# Patient Record
Sex: Female | Born: 1965 | Race: White | Hispanic: No | Marital: Married | Smoking: Current every day smoker
Health system: Southern US, Community
[De-identification: ages and names within clinical notes are randomized; demographics above are authoritative.]

## PROBLEM LIST (undated history)

## (undated) DIAGNOSIS — F32A Depression, unspecified: Secondary | ICD-10-CM

## (undated) DIAGNOSIS — F1010 Alcohol abuse, uncomplicated: Secondary | ICD-10-CM

## (undated) DIAGNOSIS — F329 Major depressive disorder, single episode, unspecified: Secondary | ICD-10-CM

## (undated) DIAGNOSIS — Z720 Tobacco use: Secondary | ICD-10-CM

## (undated) DIAGNOSIS — F419 Anxiety disorder, unspecified: Secondary | ICD-10-CM

## (undated) HISTORY — PX: ABDOMINAL HYSTERECTOMY: SHX81

---

## 2005-04-18 ENCOUNTER — Ambulatory Visit: Payer: Self-pay | Admitting: Otolaryngology

## 2005-09-20 ENCOUNTER — Ambulatory Visit: Payer: Self-pay | Admitting: Obstetrics and Gynecology

## 2006-04-06 ENCOUNTER — Ambulatory Visit: Payer: Self-pay | Admitting: Obstetrics and Gynecology

## 2006-04-16 ENCOUNTER — Ambulatory Visit: Payer: Self-pay | Admitting: Obstetrics and Gynecology

## 2006-07-22 ENCOUNTER — Emergency Department: Payer: Self-pay

## 2013-06-05 ENCOUNTER — Emergency Department: Payer: Self-pay | Admitting: Emergency Medicine

## 2013-06-05 LAB — COMPREHENSIVE METABOLIC PANEL
ALK PHOS: 60 U/L
ALT: 116 U/L — AB (ref 12–78)
Albumin: 4 g/dL (ref 3.4–5.0)
Anion Gap: 9 (ref 7–16)
BUN: 5 mg/dL — AB (ref 7–18)
Bilirubin,Total: 0.3 mg/dL (ref 0.2–1.0)
CREATININE: 0.44 mg/dL — AB (ref 0.60–1.30)
Calcium, Total: 9 mg/dL (ref 8.5–10.1)
Chloride: 103 mmol/L (ref 98–107)
Co2: 26 mmol/L (ref 21–32)
EGFR (African American): >60
Glucose: 79 mg/dL (ref 65–99)
Osmolality: 272 (ref 275–301)
Potassium: 3.8 mmol/L (ref 3.5–5.1)
SGOT(AST): 75 U/L — ABNORMAL HIGH (ref 15–37)
Sodium: 138 mmol/L (ref 136–145)
TOTAL PROTEIN: 8 g/dL (ref 6.4–8.2)

## 2013-06-05 LAB — CBC
HCT: 45.5 % (ref 35.0–47.0)
HGB: 15.2 g/dL (ref 12.0–16.0)
MCH: 33.5 pg (ref 26.0–34.0)
MCHC: 33.3 g/dL (ref 32.0–36.0)
MCV: 101 fL — ABNORMAL HIGH (ref 80–100)
PLATELETS: 252 10*3/uL (ref 150–440)
RBC: 4.53 10*6/uL (ref 3.80–5.20)
RDW: 13.4 % (ref 11.5–14.5)
WBC: 4 10*3/uL (ref 3.6–11.0)

## 2013-06-05 LAB — DRUG SCREEN, URINE

## 2013-06-05 LAB — URINALYSIS, COMPLETE
Bacteria: NONE SEEN
Bilirubin,UR: NEGATIVE
Glucose,UR: NEGATIVE mg/dL (ref 0–75)
KETONE: NEGATIVE
LEUKOCYTE ESTERASE: NEGATIVE
Nitrite: NEGATIVE
Ph: 5 (ref 4.5–8.0)
Protein: NEGATIVE
Specific Gravity: 1.003 (ref 1.003–1.030)
Squamous Epithelial: 1
WBC UR: 1 /HPF (ref 0–5)

## 2013-06-05 LAB — ETHANOL
ETHANOL %: 0.287 % — AB (ref 0.000–0.080)
Ethanol: 287 mg/dL

## 2013-06-05 LAB — ACETAMINOPHEN LEVEL: Acetaminophen: <2 ug/mL

## 2013-06-05 LAB — TSH: THYROID STIMULATING HORM: 2.3 u[IU]/mL

## 2013-06-05 LAB — SALICYLATE LEVEL: Salicylates, Serum: 2.3 mg/dL

## 2013-06-27 ENCOUNTER — Emergency Department: Payer: Self-pay | Admitting: Emergency Medicine

## 2013-06-27 LAB — COMPREHENSIVE METABOLIC PANEL
Albumin: 4 g/dL (ref 3.4–5.0)
Alkaline Phosphatase: 56 U/L
Anion Gap: 12 (ref 7–16)
BILIRUBIN TOTAL: 0.2 mg/dL (ref 0.2–1.0)
BUN: 3 mg/dL — AB (ref 7–18)
CHLORIDE: 102 mmol/L (ref 98–107)
CO2: 26 mmol/L (ref 21–32)
Calcium, Total: 8.7 mg/dL (ref 8.5–10.1)
Creatinine: 0.45 mg/dL — ABNORMAL LOW (ref 0.60–1.30)
EGFR (African American): >60
EGFR (Non-African Amer.): >60
GLUCOSE: 90 mg/dL (ref 65–99)
Osmolality: 275 (ref 275–301)
POTASSIUM: 3.6 mmol/L (ref 3.5–5.1)
SGOT(AST): 34 U/L (ref 15–37)
SGPT (ALT): 55 U/L (ref 12–78)
SODIUM: 140 mmol/L (ref 136–145)
TOTAL PROTEIN: 7.9 g/dL (ref 6.4–8.2)

## 2013-06-27 LAB — DRUG SCREEN, URINE
Amphetamines, Ur Screen: NEGATIVE (ref ?–1000)
BENZODIAZEPINE, UR SCRN: NEGATIVE (ref ?–200)
Barbiturates, Ur Screen: NEGATIVE (ref ?–200)
Cannabinoid 50 Ng, Ur ~~LOC~~: POSITIVE (ref ?–50)
Cocaine Metabolite,Ur ~~LOC~~: NEGATIVE (ref ?–300)
MDMA (Ecstasy)Ur Screen: NEGATIVE (ref ?–500)
Methadone, Ur Screen: NEGATIVE (ref ?–300)
Opiate, Ur Screen: NEGATIVE (ref ?–300)
Phencyclidine (PCP) Ur S: NEGATIVE (ref ?–25)
Tricyclic, Ur Screen: NEGATIVE (ref ?–1000)

## 2013-06-27 LAB — ETHANOL
Ethanol %: 0.346 % (ref 0.000–0.080)
Ethanol: 346 mg/dL

## 2013-06-27 LAB — URINALYSIS, COMPLETE
Bacteria: NONE SEEN
Bilirubin,UR: NEGATIVE
Blood: NEGATIVE
GLUCOSE, UR: NEGATIVE mg/dL (ref 0–75)
KETONE: NEGATIVE
Leukocyte Esterase: NEGATIVE
Nitrite: NEGATIVE
PH: 6 (ref 4.5–8.0)
PROTEIN: NEGATIVE
RBC, UR: NONE SEEN /HPF (ref 0–5)
Specific Gravity: 1.002 (ref 1.003–1.030)
Squamous Epithelial: <1
WBC UR: NONE SEEN /HPF (ref 0–5)

## 2013-06-27 LAB — CBC
HCT: 46.6 % (ref 35.0–47.0)
HGB: 15.4 g/dL (ref 12.0–16.0)
MCH: 33.1 pg (ref 26.0–34.0)
MCHC: 33.1 g/dL (ref 32.0–36.0)
MCV: 100 fL (ref 80–100)
Platelet: 249 10*3/uL (ref 150–440)
RBC: 4.66 10*6/uL (ref 3.80–5.20)
RDW: 12.7 % (ref 11.5–14.5)
WBC: 4.6 10*3/uL (ref 3.6–11.0)

## 2013-06-27 LAB — ACETAMINOPHEN LEVEL: Acetaminophen: <2 ug/mL

## 2013-06-27 LAB — SALICYLATE LEVEL: Salicylates, Serum: 2.6 mg/dL

## 2013-07-22 ENCOUNTER — Emergency Department: Payer: Self-pay | Admitting: Emergency Medicine

## 2013-07-22 LAB — COMPREHENSIVE METABOLIC PANEL
ALBUMIN: 4.3 g/dL (ref 3.4–5.0)
ALK PHOS: 63 U/L
ALT: 90 U/L — AB (ref 12–78)
ANION GAP: 11 (ref 7–16)
BUN: 4 mg/dL — ABNORMAL LOW (ref 7–18)
Bilirubin,Total: 0.3 mg/dL (ref 0.2–1.0)
CHLORIDE: 93 mmol/L — AB (ref 98–107)
Calcium, Total: 8.1 mg/dL — ABNORMAL LOW (ref 8.5–10.1)
Co2: 22 mmol/L (ref 21–32)
Creatinine: 0.62 mg/dL (ref 0.60–1.30)
EGFR (African American): >60
EGFR (Non-African Amer.): >60
GLUCOSE: 87 mg/dL (ref 65–99)
Osmolality: 250 (ref 275–301)
POTASSIUM: 3.9 mmol/L (ref 3.5–5.1)
SGOT(AST): 100 U/L — ABNORMAL HIGH (ref 15–37)
Sodium: 126 mmol/L — ABNORMAL LOW (ref 136–145)
TOTAL PROTEIN: 7.7 g/dL (ref 6.4–8.2)

## 2013-07-22 LAB — CBC
HCT: 40.7 % (ref 35.0–47.0)
HGB: 14 g/dL (ref 12.0–16.0)
MCH: 34.2 pg — ABNORMAL HIGH (ref 26.0–34.0)
MCHC: 34.4 g/dL (ref 32.0–36.0)
MCV: 99 fL (ref 80–100)
Platelet: 187 10*3/uL (ref 150–440)
RBC: 4.1 10*6/uL (ref 3.80–5.20)
RDW: 13.5 % (ref 11.5–14.5)
WBC: 5.7 10*3/uL (ref 3.6–11.0)

## 2013-07-22 LAB — URINALYSIS, COMPLETE
BACTERIA: NONE SEEN
Bilirubin,UR: NEGATIVE
Glucose,UR: NEGATIVE mg/dL (ref 0–75)
Ketone: NEGATIVE
LEUKOCYTE ESTERASE: NEGATIVE
Nitrite: NEGATIVE
PH: 5 (ref 4.5–8.0)
Protein: 30
SPECIFIC GRAVITY: 1.008 (ref 1.003–1.030)
Squamous Epithelial: 1
WBC UR: NONE SEEN /HPF (ref 0–5)

## 2013-07-22 LAB — ACETAMINOPHEN LEVEL: Acetaminophen: <2 ug/mL

## 2013-07-22 LAB — DRUG SCREEN, URINE
Amphetamines, Ur Screen: NEGATIVE (ref ?–1000)
BENZODIAZEPINE, UR SCRN: NEGATIVE (ref ?–200)
Barbiturates, Ur Screen: NEGATIVE (ref ?–200)
COCAINE METABOLITE, UR ~~LOC~~: NEGATIVE (ref ?–300)
Cannabinoid 50 Ng, Ur ~~LOC~~: POSITIVE (ref ?–50)
MDMA (ECSTASY) UR SCREEN: NEGATIVE (ref ?–500)
Methadone, Ur Screen: NEGATIVE (ref ?–300)
Opiate, Ur Screen: NEGATIVE (ref ?–300)
PHENCYCLIDINE (PCP) UR S: NEGATIVE (ref ?–25)
TRICYCLIC, UR SCREEN: NEGATIVE (ref ?–1000)

## 2013-07-22 LAB — PREGNANCY, URINE: Pregnancy Test, Urine: NEGATIVE m[IU]/mL

## 2013-07-22 LAB — ETHANOL
ETHANOL LVL: 447 mg/dL — AB
Ethanol %: 0.447 % (ref 0.000–0.080)

## 2013-07-22 LAB — SALICYLATE LEVEL: Salicylates, Serum: 3.1 mg/dL — ABNORMAL HIGH

## 2013-07-22 LAB — TSH: Thyroid Stimulating Horm: 0.687 u[IU]/mL

## 2013-07-23 LAB — SODIUM: SODIUM: 133 mmol/L — AB (ref 136–145)

## 2014-05-30 NOTE — Consult Note (Signed)
PATIENT NAMAlisia Mercer:  Sturdevant, [name] MR#:  [id number] DATE OF BIRTH:  Jan 13, 1966  DATE OF CONSULTATION:  06/28/2013  REFERRING PHYSICIAN:   CONSULTING PHYSICIAN:  Elaura Calix K. Guss Bundehalla, MD  PLACE OF DICTATION:  [facility], [place].  AGE:  48 years.  SEX:  Female.  RACE:  White.  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  The patient has two children, a 47 year old and 47 year old daughters.  Her 47 year old daughter moved in to live with her and has been since August of 2014 because she had no place to go and she was pregnant.  According to the information obtained, daughter stated that the patient was aggressive towards the daughter.  The patient reports that she does not remember being aggressive to the daughter.  She does admit that she drinks 4 or 5 beers sometimes and she was working in the yard and she does not remember having threatened her daughter.    PAST PSYCHIATRIC HISTORY:  Inpatient [facility] for a 28 day program many years ago and she stayed sober for one year and 8 months and started drinking again because of being with the wrong crowds.    ALCOHOL AND DRUGS:  Admits that she has been drinking alcohol at a rate of 4 to 5 beers every other day, had 2 DWIs and lost her driver's license.  She gets around by getting rides from her husband.  Never arrested for public drunkenness.  Denies any other street or prescription drug abuse.  Denies using IV drugs.  Does admit smoking nicotine cigarettes.    MENTAL STATUS:  The patient is dressed in hospital scrubs, alert and oriented, fully aware of situation that brought her to [facility].  Affect is bright and cheerful.  Denies feeling depressed.  Denies feeling hopeless or helpless.  No psychosis.  Denies auditory or visual hallucinations.  Denies feeling  anxious.  Denies any ideas of plans to hurt herself or others.  Cognition intact.  General knowledge of information fair, level of education.  Insight and judgment fair.   IMPRESSION:  Alcohol abuse with intoxication at the time of admission.  Recommend discontinue IVC, discharge the patient to go back home to live with her husband and the patient will contact [facility] if she needs help with her alcohol abuse problems and is aware of the same.    ____________________________ Jannet MantisSurya K. Guss Bundehalla, MD skc:ea D: 06/28/2013 21:16:36 ET T: 06/29/2013 02:58:48 ET JOB#: [id number]  cc: Monika SalkSurya K. Guss Bundehalla, MD, <Dictator> Beau FannySURYA K Analisa Sledd MD ELECTRONICALLY SIGNED 06/29/2013 20:19

## 2014-05-30 NOTE — Consult Note (Signed)
Psychiatry: Follow-up for this 49 year old woman with alcohol dependence.  She is being treated for alcohol withdrawal here in the hospital after presenting intoxicated and after suffering a fall with a head injury.  Today she complains of ongoing headache.  She says she is not feeling shaky.  Denies being sick to her stomach.  Denies suicidal ideation denies feeling depressed. review of systems she denies suicidal or homicidal ideation denies visual auditory or tactile hallucinations.  Denies feeling sick to her stomach denies tremulousness.  Generally negative review of systems. status exam: Slightly disheveled woman looks her stated age cooperative with the interview.  Eye contact good psychomotor activity sluggish.  She clearly still has a tremor despite her protest otherwise.  She is slow in her psychomotor activity and slow and quiet in her speech.  Affect blunted mood stated as being okay.  Thoughts are slow but lucid no evidence of delusional thinking.  Denies auditory or visual hallucinations denies suicidal or homicidal ideation.  Judgment and insight questionable.  Alert and oriented 4. who clearly has alcohol dependence and has presented several times to the emergency room this spring intoxicated.  She's been let go with counseling to stop drinking and has not followed up on any of it.  He said head injuries demonstrates the life-threatening nature of her condition.  At this point since she is under involuntary commitment I think the right thing to do is to get her into more serious substance abuse treatment.  Patient was advised of this.  She would prefer to go home but I think her risk of relapse and life-threatening behavior is high. plan is to continue detox protocol and monitoring here.  Referred to alcohol and drug abuse treatment Center with likely admission tomorrow. dependence diagnosis, substance induced mood disorder versus depression  Electronic Signatures: Clapacs, Jackquline DenmarkJohn T (MD)   (Signed on 18-Jun-15 15:53)  Authored  Last Updated: 18-Jun-15 15:53 by Audery Amellapacs, John T (MD)

## 2014-05-30 NOTE — Consult Note (Signed)
PATIENT NAME:  Tanya FerrariREAVIS, Marene T MR#:  [id number] DATE OF BIRTH:  [birth year]  DATE OF CONSULTATION:  07/22/2013  REFERRING PHYSICIAN:   CONSULTING PHYSICIAN:  Audery AmelJohn T. Clapacs, MD  IDENTIFYING INFORMATION AND REASON FOR CONSULTATION: A 49 year old woman a history of alcohol dependence, presents to the Emergency Room intoxicated with a report from her family that she had fallen down several times today and hit her head. Evaluation for appropriate disposition.   HISTORY OF PRESENT ILLNESS: Information obtained from the patient, the chart and the accompanying paperwork. The patient states that she has been drinking "too much." She says that she drinks probably about 15 to 20 beers a day. Also smokes marijuana regularly. She remembers that she did fall down and hit her head today. She does not have much more memory of what had happened in the last day. Denies suicidal or homicidal ideation. Reports mood as being bad but does not have a full major depressive syndrome. Does not report any psychotic symptoms.   PAST PSYCHIATRIC HISTORY: She has presented to our Emergency Room several times this spring intoxicated. She has been discharged, it looks like, and referred for followup treatment but it appears she is not going for any, or at least nothing that has kept her from drinking. She had reported on 1 of those visits that she had been to a rehab program in [place] and had stayed sober for about a year at that point. She is currently taking Zoloft and trazodone, unknown doses. Denies suicidal history. Denies homicidal behavior.   FAMILY HISTORY: Unknown.   SOCIAL HISTORY: Lives with her husband, her adult daughter and her daughter's boyfriend. She says there is often strife and fighting at home.   PAST MEDICAL HISTORY: Does not know of any significant ongoing medical problems.   CURRENT MEDICATIONS: Trazodone, unknown dose at night; Zoloft, unknown dose in the morning.   ALLERGIES: No known drug allergies.    REVIEW OF SYSTEMS: Complains of pain in her head, especially the left frontal area. She denies feeling sick to her stomach, denies throwing up. She does feel tired and worn out. She denies suicidal or homicidal ideation. Denies hallucinations, otherwise negative.   MENTAL STATUS EXAMINATION: Slightly disheveled woman, looks older than her stated age. She is passively cooperative but still intoxicated. Eye contact intermittent. Psychomotor activity very sluggish. Speech decreased in total amount and slow but easy to understand. Affect blunted. Mood stated as being bad. Thoughts are slow but lucid. No evidence of delusional thinking or loosening of associations. Denies auditory or visual hallucinations. Denies suicidal or homicidal ideation. Judgment and insight adequate. Intelligence normal. Short- and long-term memory slightly impaired.   LABORATORY, DIAGNOSTIC AND RADIOLOGICAL DATA:  A head CT was ordered and it does show some possible abnormalities. Frontal horn of the right lateral ventricle is somewhat attenuated. There is question about a possible infarct in the area. There are left frontal and occipital scalp hematomas. 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   Urinalysis does not appear to be infected. Pregnancy test negative. Drug screen positive for cannabis. TSH normal. CBC unremarkable. Alcohol level was 447 at about 1:00 today. Sodium low at 126, chloride low at 93. ALT and AST both elevated.   ASSESSMENT: A 49 year old woman with alcohol dependence, highly intoxicated, falling down, not able to take care of herself, needs inpatient treatment for detox and substance abuse treatment.   TREATMENT PLAN: Emergency Room physician earlier today placed her under involuntary commitment. We do not have any beds  available on our unit right now. I will put her on some standing Librium. She already has other appropriate detox medicines available. Education done with the patient. She has been  referred to the alcohol and drug abuse treatment center and we will follow up in hopes of getting her transferred tomorrow.   DIAGNOSIS, PRINCIPAL AND PRIMARY:  AXIS I: Alcohol dependence.   SECONDARY DIAGNOSES: AXIS I: Delirium due to alcohol intoxication.  AXIS II: Deferred.  AXIS III: Mild head injury.  AXIS IV: Severe, moderate chronic stress at home.  AXIS V: Functioning at time of evaluation 30.   ____________________________ Audery Amel, MD jtc:cs D: 07/22/2013 17:51:51 ET T: 07/22/2013 18:07:15 ET JOB#: [id number]  cc: Audery Amel, MD, <Dictator> Audery Amel MD ELECTRONICALLY SIGNED 07/23/2013 13:24

## 2014-05-30 NOTE — Consult Note (Signed)
PATIENT NAME:  Tanya Mercer, Tanya Mercer MR#:  [id number] DATE OF BIRTH:  07/12/1965  DATE OF CONSULTATION:  07/25/2013  CONSULTING PHYSICIAN:  Audery AmelJohn Mercer. Daniyal Tabor, MD  HISTORY: Since my last interaction with this patient, she is feeling better. She is feeling less shaky. She is feeling less nervous. Her mood is feeling more calm. She is no longer having a visible tremor in her hands. She is not reporting any suicidal ideation. She is stating better insight and is agreeable to substance abuse treatment.   REVIEW OF SYSTEMS: Denies suicidal ideation. Denies depression. Denies psychotic symptoms. No hallucinations. No feeling of tremor. No nausea. Generally negative review of systems.   MENTAL STATUS EXAMINATION: Neatly dressed and groomed woman, looks her stated age, cooperative and pleasant. Good eye contact. Normal psychomotor activity. No sign of tremor. Speech is clear and focused and no longer halting. Affect is appropriate and reactive. Mood is stated as okay. Thoughts appear lucid and clear, rather than confused or delirious. Denies suicidal ideation. Denies hallucinations. Short-term and long-term memory intact. Judgment and insight improved.   VITAL SIGNS: Her blood pressure most recently was 125/87, still had a pulse 104, temperature 97.8.   ASSESSMENT: The patient has been waiting in the Emergency Room because I wanted to refer her to the [facility]. She has now been in the Emergency Room since June 16 and at this point, we are told there will not be a bed available for her until at least the 22nd. She is through the worst part of alcohol withdrawal and past the window of seizures and probably out of any risk of delirium tremens. The patient is requesting discharge. At this point, I think she no longer meets commitment criteria. I am going to discontinue her involuntary commitment and recommend she be released home. She has been counseled very clearly about the life-threatening  nature of continued drinking and the importance of engaging in treatment and agrees [facility].   DIAGNOSIS, PRINCIPAL AND PRIMARY:  AXIS I: Alcohol dependence.   SECONDARY DIAGNOSES: AXIS I: Substance-induced mood disorder.  AXIS II: Deferred.  AXIS III: Alcohol withdrawal.  AXIS IV: Moderate.  AXIS V: Functioning at time of discharge 55.    ____________________________ Audery AmelJohn Mercer. Mauri Temkin, MD jtc:jcm D: 07/25/2013 16:07:13 ET Mercer: 07/25/2013 19:01:21 ET JOB#: [id number]  cc: Audery AmelJohn Mercer. Jaqlyn Gruenhagen, MD, <Dictator> Audery AmelJOHN Mercer Lawrence Mitch MD ELECTRONICALLY SIGNED 07/29/2013 17:10

## 2014-05-30 NOTE — Consult Note (Signed)
PATIENT NAME:  Tanya Mercer, Tanya Mercer MR#:  [id number] DATE OF BIRTH:  [birth year]  DATE OF CONSULTATION:  07/23/2013  CONSULTING PHYSICIAN:  Audery AmelJohn Mercer. Clapacs, MD  IDENTIFYING INFORMATION AND REASON FOR CONSULTATION: A followup on this 49 year old woman who came into the hospital intoxicated having hit her head yesterday. She had a blood alcohol level of nearly 450 when she came in yesterday afternoon; also had struck her head. There seems to be no sign of fracture or internal brain injury. The patient is now going through alcohol withdrawal. She does complain of feeling shaky. She feels a little anxious. Denies any other specific physical symptoms.   REVIEW OF SYSTEMS: Denies suicidal or homicidal ideation. Denies hallucinations. Denies feeling depressed. Endorses feeling anxious. Does admit to feeling shaky all over and a little bit sick to her stomach.   MENTAL STATUS EXAMINATION: Disheveled woman looks older than her stated age, passively cooperative. Eye contact decreased. Psychomotor activity fidgety, tremulous, a little bit confused. Affect seems flattened and confused as to her thought patterns. Slow thinking, slow speech. Mood stated as being all right. Denies hallucinations. Denies suicidal or homicidal ideation. Alert and oriented to her current situation. Judgment and insight impaired about her substance abuse. Short and long-term memory both somewhat impaired.   VITAL SIGNS: Currently, her blood pressure is 141/79, pulse 106, respirations 20, temperature 97.8.   ASSESSMENT:  A 49 year old woman presented to the hospital intoxicated. This is at least the third time she has presented to the Emergency Room this spring intoxicated. This time she had several falls, including one pretty severe fall banging her head. She has clearly not been able to stop drinking on her own and is clearly not going to any outpatient substance abuse treatment. Her behavior is getting worse and is becoming more life  threatening. Currently, she is showing obvious physical symptoms of alcohol withdrawal. She is at risk for delirium tremens and possibly a seizure.   TREATMENT PLAN: She was placed under involuntary commitment by the admitting physician. We will go ahead and refer her to the alcohol and drug abuse treatment center. Meanwhile, I am putting her on the regular CIWA protocol. We will monitor for any other signs or symptoms of alcohol withdrawal.   DIAGNOSIS, PRINCIPAL AND PRIMARY:  AXIS I: Alcohol dependence.   SECONDARY DIAGNOSES: AXIS I:   Delirium due to alcohol withdrawal.  AXIS II:  Deferred.  AXIS III: Alcohol withdrawal symptoms.  AXIS IV: Severe from her family problems and substance abuse.  AXIS V:  Functioning at time of evaluation 40.   ____________________________ Audery AmelJohn Mercer. Clapacs, MD jtc:ce D: 07/23/2013 15:00:57 ET Mercer: 07/23/2013 15:07:59 ET JOB#: [id number]  cc: Audery AmelJohn Mercer. Clapacs, MD, <Dictator> Audery AmelJOHN Mercer CLAPACS MD ELECTRONICALLY SIGNED 07/29/2013 17:10

## 2014-11-01 IMAGING — CT CT HEAD WITHOUT CONTRAST
3 of 5 series · 14 of 47 positions shown, 16 images · non-contrast
Comparison: None.

CLINICAL DATA: Pain post trauma

EXAM:
CT HEAD WITHOUT CONTRAST
CT CERVICAL SPINE WITHOUT CONTRAST
TECHNIQUE: Multidetector CT imaging of the head and cervical spine was
performed following the standard protocol without intravenous
contrast. Multiplanar CT image reconstructions of the cervical spine
were also generated.

[Series 8: sagittal bone · sagittal · 0.33mm/px · 3 of 41 slices shown]
[im 14/41  brain]
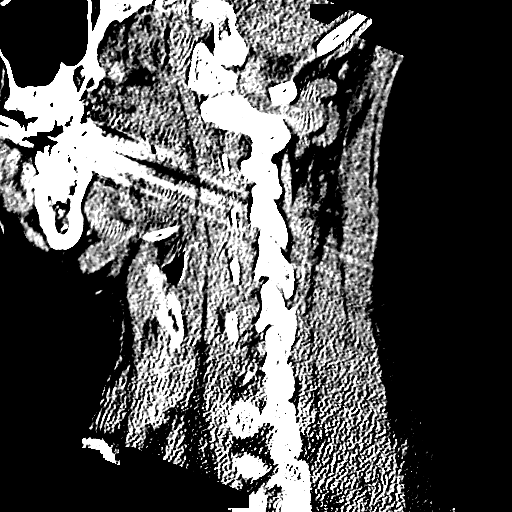
[im 21/41  brain]
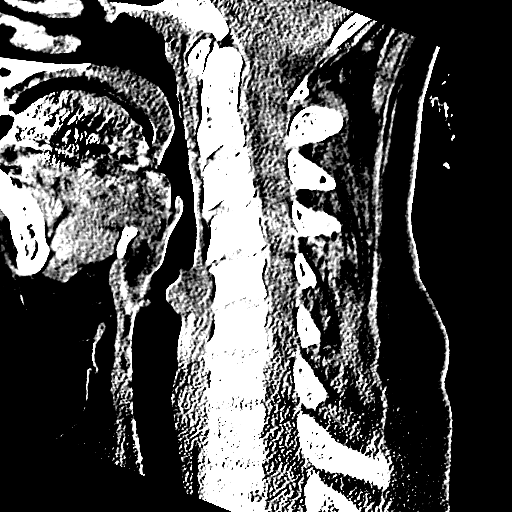
[im 27/41  brain]
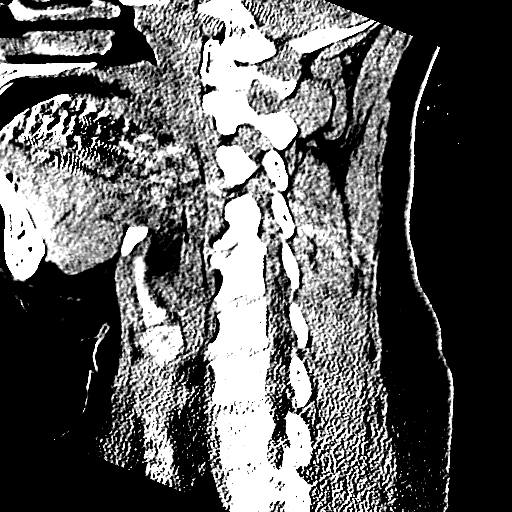

[Series 9: coronal bone · coronal · 0.32mm/px · 3 of 37 slices shown]
[im 13/37  brain]
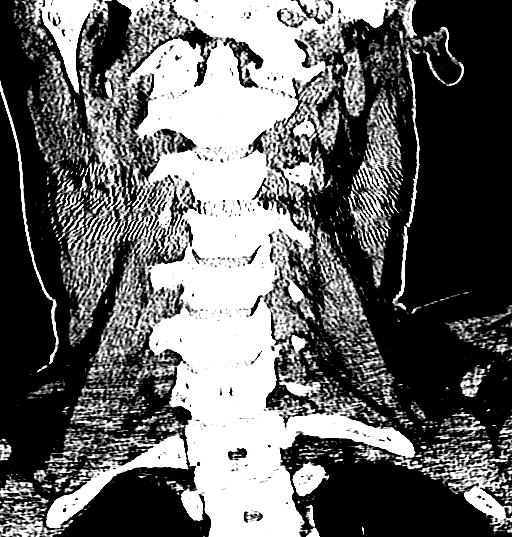
[im 17/37  brain]
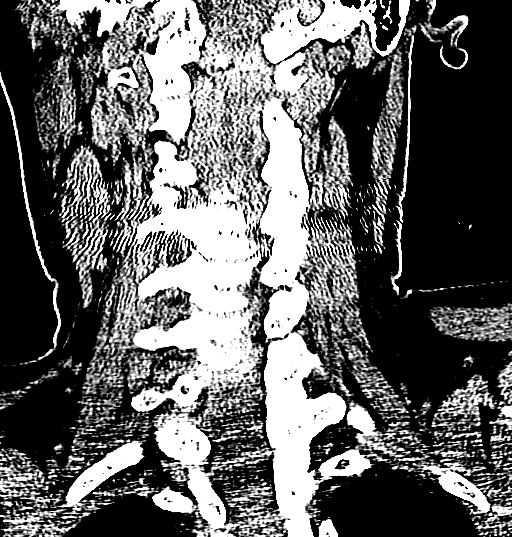
[im 21/37  brain]
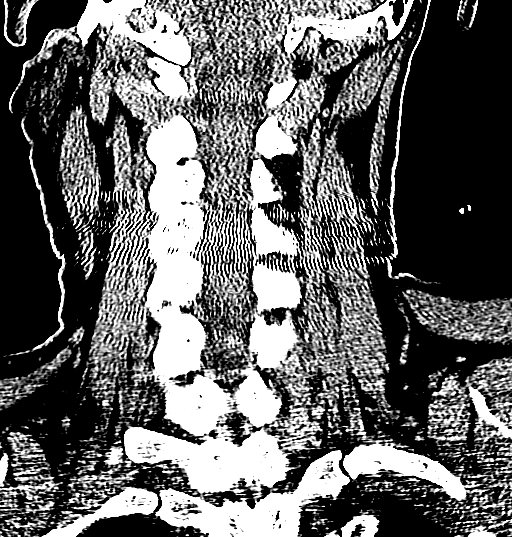

[Series 10: axial · axial · 0.28mm/px · z∈[-294,-109]mm · 8 of 121 slices shown, 10 images]
[im 9/121  brain]
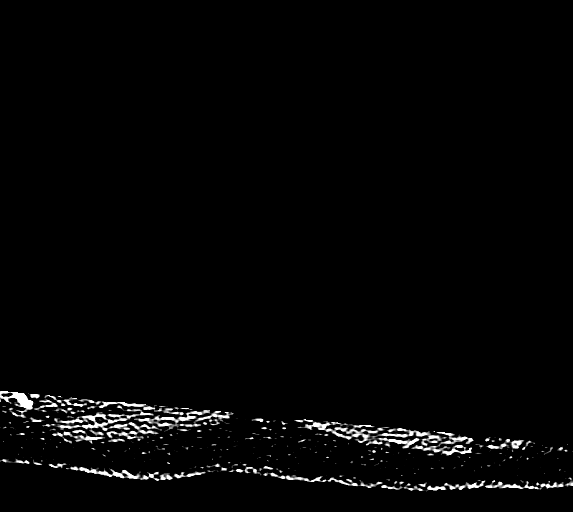
[im 9/121  bone]
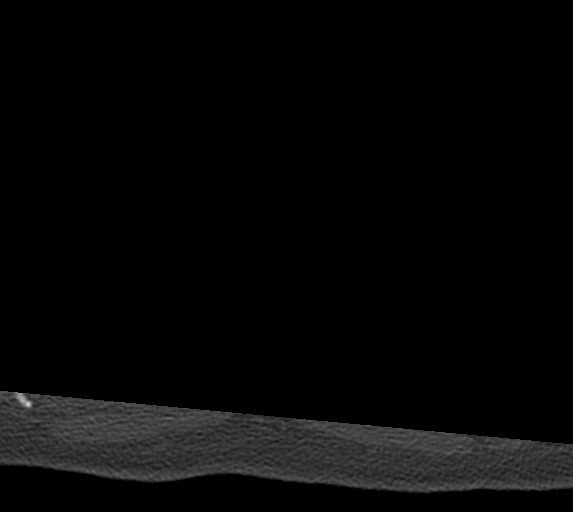
[im 26/121  brain]
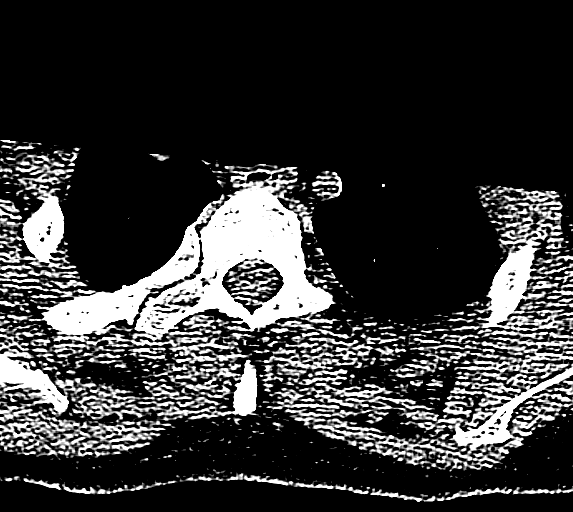
[im 43/121  brain]
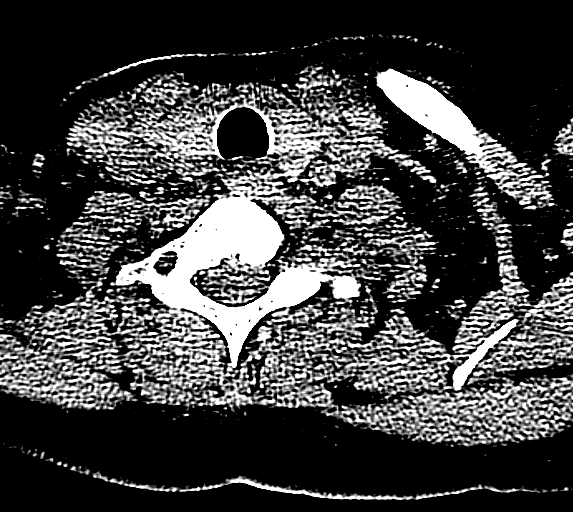
[im 52/121  brain]
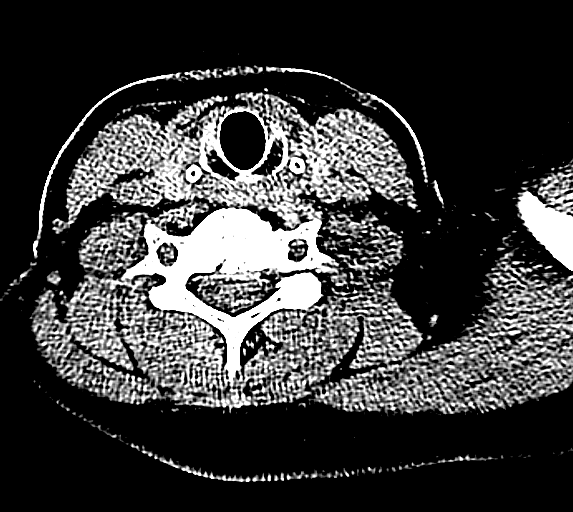
[im 69/121  brain]
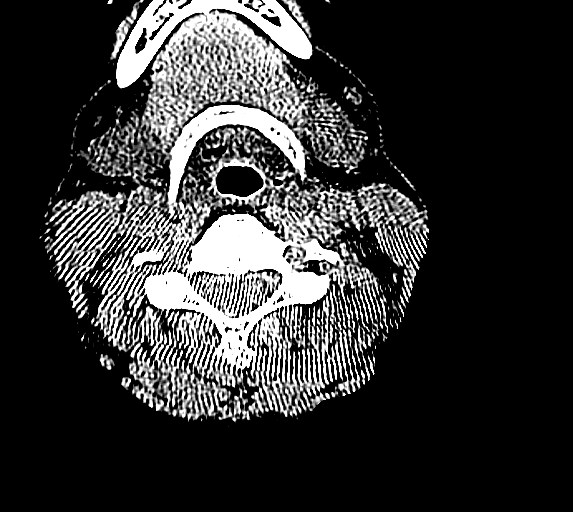
[im 69/121  bone]
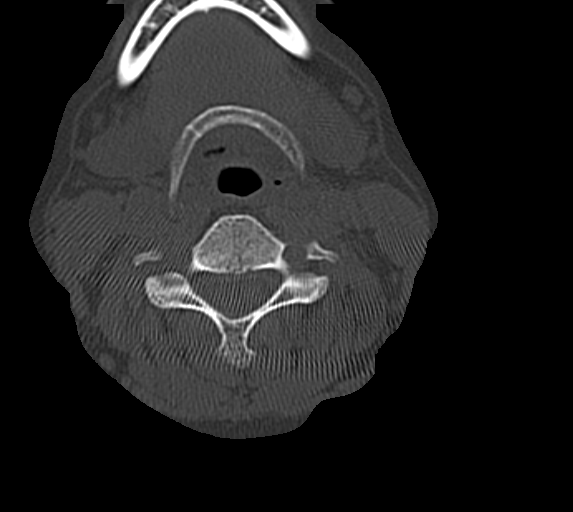
[im 78/121  brain]
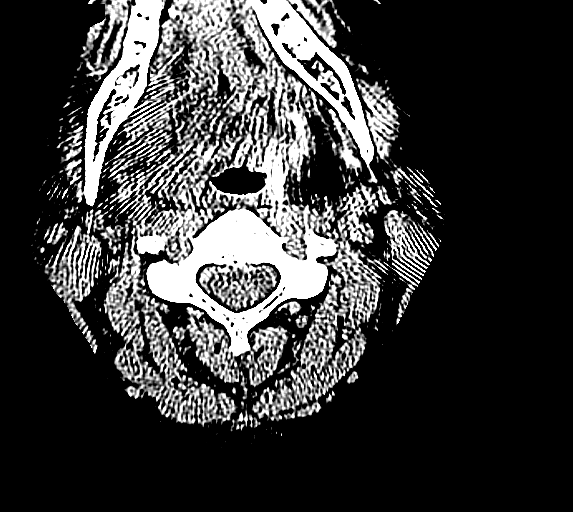
[im 95/121  brain]
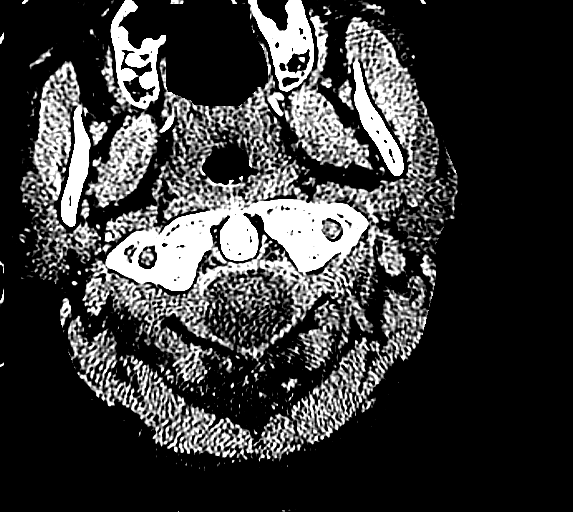
[im 112/121  brain]
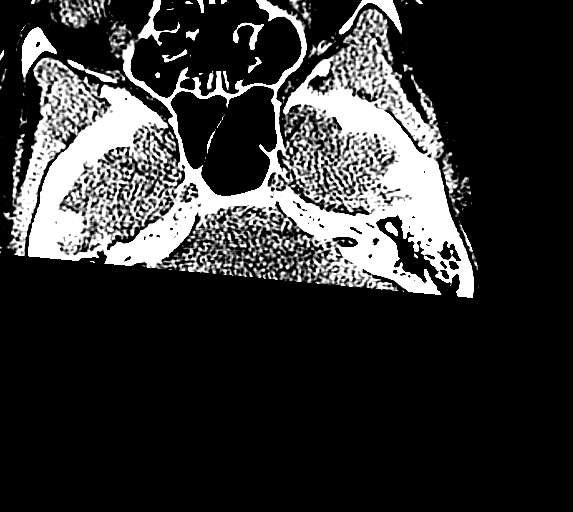

[14 of 47 positions shown; findings below may reference images not displayed]

FINDINGS: CT HEAD FINDINGS

There is no evidence of ventricular enlargement. The frontal horn of
the right lateral ventricle appear somewhat attenuated compared to
its normal appearing counterpart on the left. There is no
well-defined mass. There is no hemorrhage, extra-axial fluid
collection, or midline shift. There is subtle decreased attenuation
in the white matter of the inferior right anterior centrum semiovale
compared to the left side. A questionable early white matter infarct
in this area must be of concern. Elsewhere gray-white compartments
appear normal.

There are left frontal and occipital scalp hematomas.

There appears to be mild cerebellar tonsillar ectopia. There is a
degree of empty sella.

There is a questionable area of erosion along the superior temporal
bone on the left. This area appears asymmetric compared to the right
side which is normal. The mastoids appear clear, however. There is
no evidence of skull fracture.

CT CERVICAL SPINE FINDINGS

There appears to be a degree of cerebellar tonsillar ectopia. No
syrinx is seen in the cervical region. There is no apparent fracture
or spondylolisthesis. Prevertebral soft tissues and predental space
regions are normal.
IMPRESSION: CT head: There is a question area of subtle erosion along the
superior left temporal bone compared to the right. There is definite
asymmetry in this area. This finding may warrant MR pre and
post-contrast to further evaluate.

There is subtle decreased attenuation in the anterior right centrum
semiovale compared to the left. Note that the frontal horn of the
right lateral ventricle is somewhat attenuated compared to its
normal appearing counterpart on the left. Again, and more with
particular attention to the right inferior anterior centrum
semiovale advised to assess for possible lesion in this area.

There is no hemorrhage or extra-axial fluid.

There is apparent mild of cerebellar tonsillar ectopia. There is
empty sella.

CT cervical spine: No fracture or spondylolisthesis. Findings
suggesting a degree of Chiari I malformation. There is
osteoarthritic changes several levels. No disc extrusion or
stenosis.

## 2015-06-26 ENCOUNTER — Inpatient Hospital Stay
Admission: EM | Admit: 2015-06-26 | Discharge: 2015-07-01 | DRG: 641 | Disposition: A | Discharge status: Home / Self Care | Payer: Self-pay | Attending: Internal Medicine | Admitting: Internal Medicine

## 2015-06-26 DIAGNOSIS — Z833 Family history of diabetes mellitus: Secondary | ICD-10-CM

## 2015-06-26 DIAGNOSIS — F17200 Nicotine dependence, unspecified, uncomplicated: Secondary | ICD-10-CM | POA: Diagnosis present

## 2015-06-26 DIAGNOSIS — E871 Hypo-osmolality and hyponatremia: Principal | ICD-10-CM | POA: Diagnosis present

## 2015-06-26 DIAGNOSIS — T07XXXA Unspecified multiple injuries, initial encounter: Secondary | ICD-10-CM

## 2015-06-26 DIAGNOSIS — F10239 Alcohol dependence with withdrawal, unspecified: Secondary | ICD-10-CM | POA: Diagnosis present

## 2015-06-26 DIAGNOSIS — R296 Repeated falls: Secondary | ICD-10-CM | POA: Diagnosis present

## 2015-06-26 DIAGNOSIS — S8011XA Contusion of right lower leg, initial encounter: Secondary | ICD-10-CM | POA: Diagnosis present

## 2015-06-26 DIAGNOSIS — S20229A Contusion of unspecified back wall of thorax, initial encounter: Secondary | ICD-10-CM | POA: Diagnosis present

## 2015-06-26 DIAGNOSIS — F329 Major depressive disorder, single episode, unspecified: Secondary | ICD-10-CM | POA: Diagnosis present

## 2015-06-26 DIAGNOSIS — S40021A Contusion of right upper arm, initial encounter: Secondary | ICD-10-CM | POA: Diagnosis present

## 2015-06-26 DIAGNOSIS — Y908 Blood alcohol level of 240 mg/100 ml or more: Secondary | ICD-10-CM | POA: Diagnosis present

## 2015-06-26 DIAGNOSIS — D696 Thrombocytopenia, unspecified: Secondary | ICD-10-CM | POA: Diagnosis present

## 2015-06-26 DIAGNOSIS — F10229 Alcohol dependence with intoxication, unspecified: Secondary | ICD-10-CM | POA: Diagnosis present

## 2015-06-26 DIAGNOSIS — E876 Hypokalemia: Secondary | ICD-10-CM | POA: Diagnosis present

## 2015-06-26 DIAGNOSIS — Y929 Unspecified place or not applicable: Secondary | ICD-10-CM

## 2015-06-26 DIAGNOSIS — R58 Hemorrhage, not elsewhere classified: Secondary | ICD-10-CM

## 2015-06-26 DIAGNOSIS — F1010 Alcohol abuse, uncomplicated: Secondary | ICD-10-CM | POA: Diagnosis present

## 2015-06-26 DIAGNOSIS — W19XXXA Unspecified fall, initial encounter: Secondary | ICD-10-CM | POA: Diagnosis present

## 2015-06-26 DIAGNOSIS — S40022A Contusion of left upper arm, initial encounter: Secondary | ICD-10-CM | POA: Diagnosis present

## 2015-06-26 DIAGNOSIS — S8012XA Contusion of left lower leg, initial encounter: Secondary | ICD-10-CM | POA: Diagnosis present

## 2015-06-26 DIAGNOSIS — D6959 Other secondary thrombocytopenia: Secondary | ICD-10-CM | POA: Diagnosis present

## 2015-06-26 HISTORY — DX: Depression, unspecified: F32.A

## 2015-06-26 HISTORY — DX: Alcohol abuse, uncomplicated: F10.10

## 2015-06-26 HISTORY — DX: Major depressive disorder, single episode, unspecified: F32.9

## 2015-06-26 LAB — COMPREHENSIVE METABOLIC PANEL
ALT: 46 U/L (ref 14–54)
AST: 140 U/L — AB (ref 15–41)
Albumin: 4.4 g/dL (ref 3.5–5.0)
Alkaline Phosphatase: 66 U/L (ref 38–126)
Anion gap: 20 — ABNORMAL HIGH (ref 5–15)
BUN: <5 mg/dL — ABNORMAL LOW (ref 6–20)
CHLORIDE: 84 mmol/L — AB (ref 101–111)
CO2: 23 mmol/L (ref 22–32)
CREATININE: 0.39 mg/dL — AB (ref 0.44–1.00)
Calcium: 8.5 mg/dL — ABNORMAL LOW (ref 8.9–10.3)
GFR calc non Af Amer: >60 mL/min (ref >60)
Glucose, Bld: 91 mg/dL (ref 65–99)
Potassium: 2.9 mmol/L — CL (ref 3.5–5.1)
SODIUM: 127 mmol/L — AB (ref 135–145)
Total Bilirubin: 1.1 mg/dL (ref 0.3–1.2)
Total Protein: 7.4 g/dL (ref 6.5–8.1)

## 2015-06-26 LAB — URINE DRUG SCREEN, QUALITATIVE (ARMC ONLY)
AMPHETAMINES, UR SCREEN: NOT DETECTED
BENZODIAZEPINE, UR SCRN: NOT DETECTED
Barbiturates, Ur Screen: NOT DETECTED
Cannabinoid 50 Ng, Ur ~~LOC~~: NOT DETECTED
Cocaine Metabolite,Ur ~~LOC~~: NOT DETECTED
MDMA (ECSTASY) UR SCREEN: NOT DETECTED
Methadone Scn, Ur: NOT DETECTED
OPIATE, UR SCREEN: NOT DETECTED
PHENCYCLIDINE (PCP) UR S: NOT DETECTED
Tricyclic, Ur Screen: NOT DETECTED

## 2015-06-26 LAB — CBC
HEMATOCRIT: 34.9 % — AB (ref 35.0–47.0)
Hemoglobin: 12.3 g/dL (ref 12.0–16.0)
MCH: 35.2 pg — ABNORMAL HIGH (ref 26.0–34.0)
MCHC: 35.2 g/dL (ref 32.0–36.0)
MCV: 100.2 fL — AB (ref 80.0–100.0)
Platelets: 42 10*3/uL — ABNORMAL LOW (ref 150–440)
RBC: 3.48 MIL/uL — ABNORMAL LOW (ref 3.80–5.20)
RDW: 12.6 % (ref 11.5–14.5)
WBC: 3.6 10*3/uL (ref 3.6–11.0)

## 2015-06-26 LAB — ACETAMINOPHEN LEVEL: Acetaminophen (Tylenol), Serum: <10 ug/mL — ABNORMAL LOW (ref 10–30)

## 2015-06-26 LAB — SALICYLATE LEVEL: Salicylate Lvl: <4 mg/dL (ref 2.8–30.0)

## 2015-06-26 LAB — ETHANOL: Alcohol, Ethyl (B): 454 mg/dL (ref <5)

## 2015-06-26 MED ORDER — SODIUM CHLORIDE 0.9 % IV BOLUS (SEPSIS)
1000.0000 mL | Freq: Once | INTRAVENOUS | Status: AC
  Administered 2015-06-26: 1000 mL via INTRAVENOUS

## 2015-06-26 NOTE — ED Notes (Addendum)
Pt brought to triage via wheelchair. Pt reports she has been drinking approximately 18 beers a day for a long time. Over the last week she has fallen 4 to 5 times. Pt has numerous bruising to herhead,  face, arms back,ribs buttocks, legs and feet noted during triage. Pt speech is slow but clear and pt has strong odor of alcohol about her. Pt asking for help with her alcohol use.

## 2015-06-26 NOTE — ED Notes (Addendum)
Pt reports hx of alcohol abuse - pt states "I drink 12 beers a day". Pt has multiple bruising to face, bilateral arms, and legs, bottom and right lateral back

## 2015-06-27 ENCOUNTER — Encounter: Payer: Self-pay | Admitting: Emergency Medicine

## 2015-06-27 ENCOUNTER — Emergency Department: Payer: Self-pay

## 2015-06-27 DIAGNOSIS — F1010 Alcohol abuse, uncomplicated: Secondary | ICD-10-CM | POA: Diagnosis present

## 2015-06-27 DIAGNOSIS — D696 Thrombocytopenia, unspecified: Secondary | ICD-10-CM | POA: Diagnosis present

## 2015-06-27 LAB — C DIFFICILE QUICK SCREEN W PCR REFLEX
C DIFFICILE (CDIFF) INTERP: NEGATIVE
C DIFFICILE (CDIFF) TOXIN: NEGATIVE
C Diff antigen: NEGATIVE

## 2015-06-27 LAB — PHOSPHORUS: Phosphorus: 1.9 mg/dL — ABNORMAL LOW (ref 2.5–4.6)

## 2015-06-27 LAB — HEMOGLOBIN A1C: Hgb A1c MFr Bld: 4.5 % (ref 4.0–6.0)

## 2015-06-27 LAB — TSH: TSH: 2.155 u[IU]/mL (ref 0.350–4.500)

## 2015-06-27 MED ORDER — THIAMINE HCL 100 MG/ML IJ SOLN
Freq: Once | INTRAVENOUS | Status: AC
  Administered 2015-06-27: 06:00:00 via INTRAVENOUS
  Filled 2015-06-27: qty 1000

## 2015-06-27 MED ORDER — FOLIC ACID 1 MG PO TABS
1.0000 mg | ORAL_TABLET | Freq: Every day | ORAL | Status: DC
  Administered 2015-06-27 – 2015-07-01 (×5): 1 mg via ORAL
  Filled 2015-06-27 (×5): qty 1

## 2015-06-27 MED ORDER — ONDANSETRON HCL 4 MG PO TABS: 4.0000 mg | ORAL_TABLET | Freq: Four times a day (QID) | ORAL | Status: DC | PRN

## 2015-06-27 MED ORDER — POTASSIUM CHLORIDE IN NACL 40-0.9 MEQ/L-% IV SOLN
INTRAVENOUS | Status: DC
  Administered 2015-06-27 – 2015-07-01 (×11): 100 mL/h via INTRAVENOUS
  Filled 2015-06-27 (×16): qty 1000

## 2015-06-27 MED ORDER — POTASSIUM CHLORIDE 20 MEQ PO PACK
40.0000 meq | PACK | Freq: Two times a day (BID) | ORAL | Status: DC
  Administered 2015-06-27 – 2015-06-28 (×4): 40 meq via ORAL
  Filled 2015-06-27 (×4): qty 2

## 2015-06-27 MED ORDER — SERTRALINE HCL 50 MG PO TABS
50.0000 mg | ORAL_TABLET | Freq: Every day | ORAL | Status: DC
  Administered 2015-06-27 – 2015-07-01 (×5): 50 mg via ORAL
  Filled 2015-06-27 (×5): qty 1

## 2015-06-27 MED ORDER — ACETAMINOPHEN 325 MG PO TABS
650.0000 mg | ORAL_TABLET | Freq: Once | ORAL | Status: AC
  Administered 2015-06-27: 650 mg via ORAL
  Filled 2015-06-27: qty 2

## 2015-06-27 MED ORDER — TRAZODONE HCL 100 MG PO TABS
300.0000 mg | ORAL_TABLET | Freq: Every day | ORAL | Status: DC
  Administered 2015-06-27 – 2015-06-30 (×4): 300 mg via ORAL
  Filled 2015-06-27 (×4): qty 3

## 2015-06-27 MED ORDER — LORAZEPAM 1 MG PO TABS
1.0000 mg | ORAL_TABLET | Freq: Four times a day (QID) | ORAL | Status: DC | PRN
  Administered 2015-06-27 – 2015-06-29 (×3): 1 mg via ORAL
  Filled 2015-06-27 (×3): qty 1

## 2015-06-27 MED ORDER — THIAMINE HCL 100 MG/ML IJ SOLN: 100.0000 mg | Freq: Every day | INTRAMUSCULAR | Status: DC

## 2015-06-27 MED ORDER — LORAZEPAM 2 MG PO TABS
0.0000 mg | ORAL_TABLET | Freq: Four times a day (QID) | ORAL | Status: DC
Start: 2015-06-27 — End: 2015-06-29
  Administered 2015-06-27: 1 mg via ORAL
  Administered 2015-06-27: 22:00:00 4 mg via ORAL
  Administered 2015-06-27: 16:00:00 1 mg via ORAL
  Administered 2015-06-28 (×2): 2 mg via ORAL
  Administered 2015-06-28: 16:00:00 4 mg via ORAL
  Administered 2015-06-28: 2 mg via ORAL
  Filled 2015-06-27: qty 1
  Filled 2015-06-27: qty 2
  Filled 2015-06-27: qty 1
  Filled 2015-06-27: qty 2
  Filled 2015-06-27 (×3): qty 1
  Filled 2015-06-27: qty 2

## 2015-06-27 MED ORDER — MORPHINE SULFATE (PF) 2 MG/ML IV SOLN: 1.0000 mg | INTRAVENOUS | Status: DC | PRN

## 2015-06-27 MED ORDER — POTASSIUM CHLORIDE 10 MEQ/100ML IV SOLN
10.0000 meq | INTRAVENOUS | Status: AC
  Administered 2015-06-27 (×2): 10 meq via INTRAVENOUS
  Filled 2015-06-27 (×2): qty 100

## 2015-06-27 MED ORDER — ADULT MULTIVITAMIN W/MINERALS CH
1.0000 | ORAL_TABLET | Freq: Every day | ORAL | Status: DC
  Administered 2015-06-27 – 2015-07-01 (×5): 1 via ORAL
  Filled 2015-06-27 (×5): qty 1

## 2015-06-27 MED ORDER — VITAMIN B-1 100 MG PO TABS
100.0000 mg | ORAL_TABLET | Freq: Every day | ORAL | Status: DC
  Administered 2015-06-27 – 2015-07-01 (×5): 100 mg via ORAL
  Filled 2015-06-27 (×5): qty 1

## 2015-06-27 MED ORDER — DOCUSATE SODIUM 100 MG PO CAPS
100.0000 mg | ORAL_CAPSULE | Freq: Two times a day (BID) | ORAL | Status: DC
  Administered 2015-06-27 – 2015-07-01 (×6): 100 mg via ORAL
  Filled 2015-06-27 (×7): qty 1

## 2015-06-27 MED ORDER — LORAZEPAM 2 MG PO TABS
0.0000 mg | ORAL_TABLET | Freq: Two times a day (BID) | ORAL | Status: DC
  Administered 2015-06-29: 4 mg via ORAL

## 2015-06-27 MED ORDER — ONDANSETRON HCL 4 MG/2ML IJ SOLN: 4.0000 mg | Freq: Four times a day (QID) | INTRAMUSCULAR | Status: DC | PRN

## 2015-06-27 MED ORDER — POTASSIUM CHLORIDE CRYS ER 20 MEQ PO TBCR: 40.0000 meq | EXTENDED_RELEASE_TABLET | ORAL | Status: DC

## 2015-06-27 MED ORDER — POTASSIUM CHLORIDE CRYS ER 20 MEQ PO TBCR
40.0000 meq | EXTENDED_RELEASE_TABLET | Freq: Once | ORAL | Status: AC
  Administered 2015-06-27: 40 meq via ORAL
  Filled 2015-06-27: qty 2

## 2015-06-27 MED ORDER — LORAZEPAM 2 MG/ML IJ SOLN
1.0000 mg | Freq: Four times a day (QID) | INTRAMUSCULAR | Status: DC | PRN
  Administered 2015-06-29 – 2015-06-30 (×2): 1 mg via INTRAVENOUS
  Filled 2015-06-27 (×2): qty 1

## 2015-06-27 NOTE — ED Notes (Signed)
Took patient to bathroom with assistance of NT. Pt ambulated with unsteady gait

## 2015-06-27 NOTE — H&P (Addendum)
Tanya Mercer is an 50 y.o. female.   Chief Complaint: Falls HPI: The patient with past medical history significant for alcohol abuse and depression presents emergency department after suffering multiple falls. The patient cannot recall the exact number of falls today though she admits to falling routinely. She is concerned about her bruising. She is also concerned about her general weakness. In the emergency department skeletal survey showed no fractures but she does have significant thrombocytopenia and other electrolyte abnormalities which brought to the emergency department staff to call for admission.  Past Medical History  Diagnosis Date  . Alcohol abuse   . Depression     Past Surgical History  Procedure Laterality Date  . Abdominal hysterectomy      Family History  Problem Relation Age of Onset  . Diabetes Mellitus II Mother    Social History:  reports that she has been smoking.  She does not have any smokeless tobacco history on file. She reports that she drinks about 7.2 oz of alcohol per week. Her drug history is not on file.  Allergies: No Known Allergies  Medications Prior to Admission  Medication Sig Dispense Refill  . sertraline (ZOLOFT) 50 MG tablet Take 50 mg by mouth daily.    . trazodone (DESYREL) 300 MG tablet Take 300 mg by mouth at bedtime.      Results for orders placed or performed during the hospital encounter of 06/26/15 (from the past 48 hour(s))  Comprehensive metabolic panel     Status: Abnormal   Collection Time: 06/26/15 10:37 PM  Result Value Ref Range   Sodium 127 (L) 135 - 145 mmol/L   Potassium 2.9 (LL) 3.5 - 5.1 mmol/L    Comment: CRITICAL RESULT CALLED TO, READ BACK BY AND VERIFIED WITH JENNA ELLINGTON AT 2336 06/26/15.PMH   Chloride 84 (L) 101 - 111 mmol/L   CO2 23 22 - 32 mmol/L   Glucose, Bld 91 65 - 99 mg/dL   BUN <5 (L) 6 - 20 mg/dL   Creatinine, Ser 0.39 (L) 0.44 - 1.00 mg/dL   Calcium 8.5 (L) 8.9 - 10.3 mg/dL   Total Protein 7.4  6.5 - 8.1 g/dL   Albumin 4.4 3.5 - 5.0 g/dL   AST 140 (H) 15 - 41 U/L   ALT 46 14 - 54 U/L   Alkaline Phosphatase 66 38 - 126 U/L   Total Bilirubin 1.1 0.3 - 1.2 mg/dL   GFR calc non Af Amer >60 >60 mL/min   GFR calc Af Amer >60 >60 mL/min    Comment: (NOTE) The eGFR has been calculated using the CKD EPI equation. This calculation has not been validated in all clinical situations. eGFR's persistently <60 mL/min signify possible Chronic Kidney Disease.    Anion gap 20 (H) 5 - 15  Ethanol     Status: Abnormal   Collection Time: 06/26/15 10:37 PM  Result Value Ref Range   Alcohol, Ethyl (B) 454 (HH) <5 mg/dL    Comment: CRITICAL RESULT CALLED TO, READ BACK BY AND VERIFIED WITH JENNA ELLINGTON AT 2336  06/26/15.PMH        LOWEST DETECTABLE LIMIT FOR SERUM ALCOHOL IS 5 mg/dL FOR MEDICAL PURPOSES ONLY   cbc     Status: Abnormal   Collection Time: 06/26/15 10:37 PM  Result Value Ref Range   WBC 3.6 3.6 - 11.0 K/uL   RBC 3.48 (L) 3.80 - 5.20 MIL/uL   Hemoglobin 12.3 12.0 - 16.0 g/dL   HCT 34.9 (L) 35.0 -  47.0 %   MCV 100.2 (H) 80.0 - 100.0 fL   MCH 35.2 (H) 26.0 - 34.0 pg   MCHC 35.2 32.0 - 36.0 g/dL   RDW 12.6 11.5 - 14.5 %   Platelets 42 (L) 150 - 440 K/uL  Urine Drug Screen, Qualitative     Status: None   Collection Time: 06/26/15 10:37 PM  Result Value Ref Range   Tricyclic, Ur Screen NONE DETECTED NONE DETECTED   Amphetamines, Ur Screen NONE DETECTED NONE DETECTED   MDMA (Ecstasy)Ur Screen NONE DETECTED NONE DETECTED   Cocaine Metabolite,Ur [place] NONE DETECTED NONE DETECTED   Opiate, Ur Screen NONE DETECTED NONE DETECTED   Phencyclidine (PCP) Ur S NONE DETECTED NONE DETECTED   Cannabinoid 50 Ng, Ur [place] NONE DETECTED NONE DETECTED   Barbiturates, Ur Screen NONE DETECTED NONE DETECTED   Benzodiazepine, Ur Scrn NONE DETECTED NONE DETECTED   Methadone Scn, Ur NONE DETECTED NONE DETECTED    Comment: (NOTE) 917  Tricyclics, urine               Cutoff 1000 ng/mL 200   Amphetamines, urine             Cutoff 1000 ng/mL 300  MDMA (Ecstasy), urine           Cutoff 500 ng/mL 400  Cocaine Metabolite, urine       Cutoff 300 ng/mL 500  Opiate, urine                   Cutoff 300 ng/mL 600  Phencyclidine (PCP), urine      Cutoff 25 ng/mL 700  Cannabinoid, urine              Cutoff 50 ng/mL 800  Barbiturates, urine             Cutoff 200 ng/mL 900  Benzodiazepine, urine           Cutoff 200 ng/mL 1000 Methadone, urine                Cutoff 300 ng/mL 1100 1200 The urine drug screen provides only a preliminary, unconfirmed 1300 analytical test result and should not be used for non-medical 1400 purposes. Clinical consideration and professional judgment should 1500 be applied to any positive drug screen result due to possible 1600 interfering substances. A more specific alternate chemical method 1700 must be used in order to obtain a confirmed analytical result.  1800 Gas chromato graphy / mass spectrometry (GC/MS) is the preferred 1900 confirmatory method.   Salicylate level     Status: None   Collection Time: 06/26/15 10:37 PM  Result Value Ref Range   Salicylate Lvl <9.1 2.8 - 30.0 mg/dL  Acetaminophen level     Status: Abnormal   Collection Time: 06/26/15 10:37 PM  Result Value Ref Range   Acetaminophen (Tylenol), Serum <10 (L) 10 - 30 ug/mL    Comment:        THERAPEUTIC CONCENTRATIONS VARY SIGNIFICANTLY. A RANGE OF 10-30 ug/mL MAY BE AN EFFECTIVE CONCENTRATION FOR MANY PATIENTS. HOWEVER, SOME ARE BEST TREATED AT CONCENTRATIONS OUTSIDE THIS RANGE. ACETAMINOPHEN CONCENTRATIONS >150 ug/mL AT 4 HOURS AFTER INGESTION AND >50 ug/mL AT 12 HOURS AFTER INGESTION ARE OFTEN ASSOCIATED WITH TOXIC REACTIONS.    Dg Chest 2 View  06/27/2015  CLINICAL DATA:  Acute onset of back and rib bruising. Initial encounter. EXAM: CHEST  2 VIEW COMPARISON:  None. FINDINGS: The lungs are well-aerated and clear. There is no evidence of focal opacification,  pleural effusion  or pneumothorax. The heart is normal in size; the mediastinal contour is within normal limits. No acute osseous abnormalities are seen. IMPRESSION: No acute cardiopulmonary process seen. No displaced rib fractures identified. Electronically Signed   By: Garald Balding M.D.   On: 06/27/2015 01:02   Ct Head Wo Contrast  06/27/2015  CLINICAL DATA:  History of drinking. Four 5 falls last week. Bruises to the forehead, face, arms, legs, and throughout the body. Head injury. EXAM: CT HEAD WITHOUT CONTRAST CT CERVICAL SPINE WITHOUT CONTRAST TECHNIQUE: Multidetector CT imaging of the head and cervical spine was performed following the standard protocol without intravenous contrast. Multiplanar CT image reconstructions of the cervical spine were also generated. COMPARISON:  07/22/2013 FINDINGS: CT HEAD FINDINGS Mild diffuse sulcal prominence suggesting atrophy. No ventricular dilatation. Ventricles and sulci appear symmetrical. No mass effect or midline shift. No abnormal extra-axial fluid collections. Gray-white matter junctions are distinct. Basal cisterns are not effaced. No evidence of acute intracranial hemorrhage. No depressed skull fractures. Bone erosion and expansile change in the superior left temporal bone again demonstrated, similar to previous study. Neoplasm not excluded. As previously suggested, MRI may be useful in further evaluation. No significant change since previous study. Paranasal sinuses and mastoid air cells are not opacified. CT CERVICAL SPINE FINDINGS There is straightening of the usual cervical lordosis. This may be due to patient positioning or degenerative change but ligamentous injury or muscle spasm could also have this appearance and are not excluded. Degenerative changes with narrowed interspaces and endplate hypertrophic changes in the lower cervical spine. Prominent disc osteophyte complex at C4-5 likely causes encroachment upon the central canal. Normal alignment of the posterior facet  joints. No anterior subluxation of the cervical vertebrae. No vertebral compression deformities. No prevertebral soft tissue swelling. C1-2 articulation appears intact. Head is tilted towards the left, possibly representing scoliosis or muscle spasm. Soft tissues are unremarkable. IMPRESSION: No acute intracranial abnormalities. Early atrophic changes. 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 Neoplasm remains to be excluded and may consider MR for further evaluation. Nonspecific straightening of usual cervical lordosis. Degenerative changes in the cervical spine. No acute displaced fractures identified. Electronically Signed   By: Lucienne Capers M.D.   On: 06/27/2015 01:09   Ct Cervical Spine Wo Contrast  06/27/2015  CLINICAL DATA:  History of drinking. Four 5 falls last week. Bruises to the forehead, face, arms, legs, and throughout the body. Head injury. EXAM: CT HEAD WITHOUT CONTRAST CT CERVICAL SPINE WITHOUT CONTRAST TECHNIQUE: Multidetector CT imaging of the head and cervical spine was performed following the standard protocol without intravenous contrast. Multiplanar CT image reconstructions of the cervical spine were also generated. COMPARISON:  07/22/2013 FINDINGS: CT HEAD FINDINGS Mild diffuse sulcal prominence suggesting atrophy. No ventricular dilatation. Ventricles and sulci appear symmetrical. No mass effect or midline shift. No abnormal extra-axial fluid collections. Gray-white matter junctions are distinct. Basal cisterns are not effaced. No evidence of acute intracranial hemorrhage. No depressed skull fractures. Bone erosion and expansile change in the superior left temporal bone again demonstrated, similar to previous study. Neoplasm not excluded. As previously suggested, MRI may be useful in further evaluation. No significant change since previous study. Paranasal sinuses and mastoid air cells are not opacified. CT CERVICAL SPINE FINDINGS  There is straightening of the usual cervical lordosis. This may be due to patient positioning or degenerative change but ligamentous injury or muscle spasm could also have this appearance and are  not excluded. Degenerative changes with narrowed interspaces and endplate hypertrophic changes in the lower cervical spine. Prominent disc osteophyte complex at C4-5 likely causes encroachment upon the central canal. Normal alignment of the posterior facet joints. No anterior subluxation of the cervical vertebrae. No vertebral compression deformities. No prevertebral soft tissue swelling. C1-2 articulation appears intact. Head is tilted towards the left, possibly representing scoliosis or muscle spasm. Soft tissues are unremarkable. IMPRESSION: No acute intracranial abnormalities. Early atrophic changes. 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 Neoplasm remains to be excluded and may consider MR for further evaluation. Nonspecific straightening of usual cervical lordosis. Degenerative changes in the cervical spine. No acute displaced fractures identified. Electronically Signed   By: Lucienne Capers M.D.   On: 06/27/2015 01:09   Dg Humerus Right  06/27/2015  CLINICAL DATA:  Status post falls, with right arm bruising. Initial encounter. EXAM: RIGHT HUMERUS - 2+ VIEW COMPARISON:  None. FINDINGS: There is no evidence of fracture or dislocation. The right humerus appears intact. The right humeral head remains seated at the glenoid fossa. The right acromioclavicular joint is grossly unremarkable. The elbow joint is grossly unremarkable in appearance. The visualized portions of the right lung are clear. IMPRESSION: No evidence of fracture or dislocation. Electronically Signed   By: Garald Balding M.D.   On: 06/27/2015 01:03   Dg Femur, Min 2 Views Right  06/27/2015  CLINICAL DATA:  Long history of drinking. Multiple falls over the last week. Bruising to the legs. EXAM:  RIGHT FEMUR 2 VIEWS COMPARISON:  None. FINDINGS: There is no evidence of fracture or other focal bone lesions. Soft tissues are unremarkable. IMPRESSION: Negative. Electronically Signed   By: Lucienne Capers M.D.   On: 06/27/2015 01:37    Review of Systems  Constitutional: Negative for fever and chills.  HENT: Negative for sore throat and tinnitus.   Eyes: Negative for blurred vision and redness.  Respiratory: Negative for cough and shortness of breath.   Cardiovascular: Negative for chest pain, palpitations, orthopnea and PND.  Gastrointestinal: Negative for nausea, vomiting, abdominal pain and diarrhea.  Genitourinary: Negative for dysuria, urgency and frequency.  Musculoskeletal: Positive for falls. Negative for myalgias and joint pain.  Skin: Negative for rash.       No lesions  Neurological: Negative for speech change, focal weakness and weakness.  Endo/Heme/Allergies: Does not bruise/bleed easily.       No temperature intolerance  Psychiatric/Behavioral: Negative for depression and suicidal ideas.    Blood pressure 114/86, pulse 112, temperature 98.6 F (37 C), temperature source Oral, resp. rate 14, height 5' 1"  (1.549 m), weight 45.36 kg (100 lb), SpO2 96 %. Physical Exam  Vitals reviewed. Constitutional: She is oriented to person, place, and time. She appears well-developed and well-nourished.  HENT:  Head: Normocephalic and atraumatic.  Eyes: EOM are normal. Pupils are equal, round, and reactive to light.  Neck: Normal range of motion. No tracheal deviation present. No thyromegaly present.  Cardiovascular: Normal rate, regular rhythm and normal heart sounds.  Exam reveals no gallop and no friction rub.   No murmur heard. Respiratory: Effort normal and breath sounds normal.  GI: Soft. Bowel sounds are normal. She exhibits no distension. There is no tenderness.  Lymphadenopathy:    She has no cervical adenopathy.  Neurological: She is alert and oriented to person, place,  and time. No cranial nerve deficit. She exhibits normal muscle tone.  Skin: Skin is warm and dry. Bruising (  facial, back, arms, legs; varying ages) noted.  Psychiatric: She has a normal mood and affect. Judgment and thought content normal.     Assessment/Plan This is a 50 year old female admitted for alcohol intoxication, hyponatremia and thrombocytopenia. 1. Alcohol abuse: Chronic, severe. Alcohol level is greater than 400 though the patient is awake and speaking clearly. She admits to drinking 12-18 beers per day. We will hydrate with intravenous saline and place the patient on CIWA precautions. Do not expect signs or symptoms of delirium tremens for 48 hours. Also replace thiamine and folic acid. 2. Hyponatremia: Secondary to alcohol intake. Encourage by mouth intake. Check sodium periodically to ensure safe rate of increase. 3. Thrombocytopenia: Secondary to alcohol abuse. No bleeding diatheses. Hold anticoagulants 4. Depression: Continue sertraline 5. DVT prophylaxis: SCDs 6. GI prophylaxis: None The patient is a full code. Time spent on admission orders and patient care approximately 45 minutes  Harrie Foreman, MD 06/27/2015, 2:44 AM

## 2015-06-27 NOTE — ED Provider Notes (Signed)
[facility] Emergency Department Provider Note   ____________________________________________  Time seen: Approximately 2327 AM  I have reviewed the triage vital signs and the nursing notes.   HISTORY  Chief Complaint Fall and Alcohol Problem   HPI Tanya Mercer is a 50 y.o. female who comes into the hospital today with multiple falls. The patient has a history of alcohol abuse and reports that she drinks 12 beers daily. She reports that she last fell yesterday and hit her head. She reports that she has not been evaluated for any of her falls.The patient reports that she is concerned that she is bruising in her back hurts as well as her arm hurts and her head hurts. The patient reports that she has done detox before approximately 5 years ago and bent multiple days at [facility] She reports that her daughter brought her here and she is here to get evaluated for her head. She denies any suicidal or homicidal ideation. She does have a history of depression. The patient reports that she is not interested in detox today. She reports that her pain as a 9 out of 10 in intensity and this includes her back and face.   Past Medical History  Diagnosis Date  . Alcohol abuse   . Depression     Patient Active Problem List   Diagnosis Date Noted  . Thrombocytopenia (HCC) 06/27/2015    Past Surgical History  Procedure Laterality Date  . Abdominal hysterectomy      Current Outpatient Rx  Name  Route  Sig  Dispense  Refill  . sertraline (ZOLOFT) 50 MG tablet   Oral   Take 50 mg by mouth daily.         . trazodone (DESYREL) 300 MG tablet   Oral   Take 300 mg by mouth at bedtime.           Allergies Review of patient's allergies indicates no known allergies.  No family history on file.  Social History Social History  Substance Use Topics  . Smoking status: Current Every Day Smoker  . Smokeless tobacco: Not on file  . Alcohol Use: 7.2 oz/week    12  Cans of beer per week    Review of Systems Constitutional: No fever/chills Eyes: No visual changes. ENT: No sore throat. Cardiovascular: Denies chest pain. Respiratory: Denies shortness of breath. Gastrointestinal: No abdominal pain.  No nausea, no vomiting.  No diarrhea.  No constipation. Genitourinary: Negative for dysuria. Musculoskeletal:  back pain, arm pain Skin: multiple bruises Neurological: headache  10-point ROS otherwise negative.  ____________________________________________   PHYSICAL EXAM:  VITAL SIGNS: ED Triage Vitals  Enc Vitals Group     BP 06/26/15 2224 118/81 mmHg     Pulse Rate 06/26/15 2224 111     Resp 06/26/15 2224 18     Temp 06/26/15 2224 98.6 F (37 C)     Temp Source 06/26/15 2224 Oral     SpO2 06/26/15 2224 96 %     Weight 06/26/15 2224 100 lb (45.36 kg)     Height 06/26/15 2224 5\' 1"  (1.549 m)     Head Cir --      Peak Flow --      Pain Score 06/26/15 2225 0     Pain Loc --      Pain Edu? --      Excl. in GC? --     Constitutional: Alert and oriented. Well appearing and in mild to moderate distress.  Eyes: Conjunctivae are normal. PERRL. EOMI. Head: Atraumatic. Nose: No congestion/rhinnorhea. Mouth/Throat: Mucous membranes are moist.  Oropharynx non-erythematous. Neck: No cervical spine tenderness to palpation. Cardiovascular: Normal rate, regular rhythm. Grossly normal heart sounds.  Good peripheral circulation. Respiratory: Normal respiratory effort.  No retractions. Lungs CTAB. Gastrointestinal: Soft and nontender. No distention. Positive bowel sounds Musculoskeletal: No lower extremity tenderness nor edema.  Neurologic:  Normal speech and language. Skin:  Skin is warm, dry and intact. Patient has bruises to multiple areas of her body including her face her forehead her right flank her right leg right and left arms in different stages of healing Psychiatric: Mood and affect are normal.    ____________________________________________   LABS (all labs ordered are listed, but only abnormal results are displayed)  Labs Reviewed  COMPREHENSIVE METABOLIC PANEL - Abnormal; Notable for the following:    Sodium 127 (*)    Potassium 2.9 (*)    Chloride 84 (*)    BUN <5 (*)    Creatinine, Ser 0.39 (*)    Calcium 8.5 (*)    AST 140 (*)    Anion gap 20 (*)    All other components within normal limits  ETHANOL - Abnormal; Notable for the following:    Alcohol, Ethyl (B) 454 (*)    All other components within normal limits  CBC - Abnormal; Notable for the following:    RBC 3.48 (*)    HCT 34.9 (*)    MCV 100.2 (*)    MCH 35.2 (*)    Platelets 42 (*)    All other components within normal limits  ACETAMINOPHEN LEVEL - Abnormal; Notable for the following:    Acetaminophen (Tylenol), Serum <10 (*)    All other components within normal limits  URINE DRUG SCREEN, QUALITATIVE ([facility] ONLY)  SALICYLATE LEVEL   ____________________________________________  EKG  none ____________________________________________  RADIOLOGY  CT head and cervical spine: No acute intracranial abnormalities, early atrophic changes, expansile bone lesion in the left temporal bone was present without change since previous study, nonspecific straightening of the usual cervical lordosis, degenerative changes in the cervical spine, no acute displaced fractures.  Humerus x-ray right: No evidence of fracture or dislocation  Chest x-ray: No acute cardiopulmonary process seen, no displaced rib fractures identified.  Right femur x-ray: Negative ____________________________________________   PROCEDURES  Procedure(s) performed: None  Critical Care performed: No  ____________________________________________   INITIAL IMPRESSION / ASSESSMENT AND PLAN / ED COURSE  Pertinent labs & imaging results that were available during my care of the patient were reviewed by me and considered in my medical  decision making (see chart for details).  This is a 50 year old female who comes into the hospital today with a history of alcoholism and frequent falls. The patient has multiple bruises and platelets of 45. The patient also has some hyponatremia and hypokalemia. I will give the patient a liter of normal saline and she is tachycardic but given the patient's level of hyponatremia I feel it is more appropriate to admit the patient to the hospitalist service. I will give the patient some Tylenol for her pain and she will be admitted to the hospitalist service. The patient has no further questions or concerns at this time. ____________________________________________   FINAL CLINICAL IMPRESSION(S) / ED DIAGNOSES  Final diagnoses:  Fall  Hyponatremia  Hypokalemia  Thrombocytopenia (HCC)  Ecchymosis  Multiple contusions      NEW MEDICATIONS STARTED DURING THIS VISIT:  New Prescriptions   No medications on file  Note:  This document was prepared using Dragon voice recognition software and may include unintentional dictation errors.    Rebecka ApleyAllison P Gabriella Woodhead, MD 06/27/15 [phone]

## 2015-06-27 NOTE — ED Notes (Signed)
Patient began to cough/choke on potassium tablets. RN attempted to dislodge pills with forceful strikes to back. Pt swallowed pills. Pt coughed up dark brown/black mucous.  MD Zenda AlpersWebster notified. MD Zenda AlpersWebster and MD Sheryle Hailiamond came to bedside

## 2015-06-27 NOTE — Progress Notes (Signed)
    This is a 50 year old female admitted for alcohol intoxication, hyponatremia and thrombocytopenia. 1. Alcohol abuse: Patient currently tremulous continue Sever protocol 2. Hyponatremia: Secondary to alcohol intake. Encourage by mouth intake. IV fluids with normal saline 3. Thrombocytopenia: Secondary to alcohol abuse. No bleeding diatheses. Hold anticoagulants 4. Hypokalemia replace potassium 5. Depression: Continue sertraline 6. DVT prophylaxis: SCDs 7. GI prophylaxis: None

## 2015-06-28 DIAGNOSIS — F1010 Alcohol abuse, uncomplicated: Secondary | ICD-10-CM

## 2015-06-28 LAB — BASIC METABOLIC PANEL
Anion gap: 13 (ref 5–15)
CALCIUM: 8.6 mg/dL — AB (ref 8.9–10.3)
CO2: 19 mmol/L — ABNORMAL LOW (ref 22–32)
CREATININE: 0.42 mg/dL — AB (ref 0.44–1.00)
Chloride: 101 mmol/L (ref 101–111)
GFR calc Af Amer: >60 mL/min (ref >60)
GLUCOSE: 61 mg/dL — AB (ref 65–99)
Potassium: 4 mmol/L (ref 3.5–5.1)
SODIUM: 133 mmol/L — AB (ref 135–145)

## 2015-06-28 MED ORDER — NICOTINE 21 MG/24HR TD PT24
21.0000 mg | MEDICATED_PATCH | Freq: Every day | TRANSDERMAL | Status: DC
  Administered 2015-06-28 – 2015-07-01 (×4): 21 mg via TRANSDERMAL
  Filled 2015-06-28 (×4): qty 1

## 2015-06-28 NOTE — Care Management (Signed)
Admitted to this facility with the diagnosis of thrombocytopenia and alcohol withdrawal. Lives with husband, Maisie Fushomas [phone]). Multiple falls recorded.  Received consult for husband want long term rehabilitation for wife. Will defer to Clinical Child psychotherapistocial Worker. Gwenette GreetBrenda S Aubre Quincy RN MSN CCM Care Management [phone]

## 2015-06-28 NOTE — Evaluation (Signed)
Physical Therapy Evaluation Patient Details Name: Tanya Mercer MRN: [id number] DOB: September 24, 1965 Today's Date: 06/28/2015   History of Present Illness  Patient is a 50 y/o female admitted after multiple falls, with thrombocytopenia. Currently reported to be withdrawing from alcohol.   Clinical Impression  Patient presents from home, however is extremely lethargic as she withdraws from alcohol. She is able to recall life events, pets, family, etc. However is very short with her responses within this session. Upon sitting, her HR increased to 143 bpm and PT deemed this to be too high for further mobility. PT alerted RN of elevated HR. Patient did require some assistance to transfer to EOB, noted to have shaking in UEs (she reports this is chronic). She appears quite sick upon inspection and has had numerous falls at home it appears. From reading notes of other providers, it appears there is discussion of moving her to rehab facility for alcohol dependence. Would recommend STR in the mean time to increase her safety with mobility and tolerance for ambulation if this is not provided at alcohol rehab center. Further OOB assessment pending improved cardiopulmonary vitals.     Follow Up Recommendations SNF    Equipment Recommendations  Rolling walker with 5" wheels    Recommendations for Other Services       Precautions / Restrictions Precautions Precautions: Fall Restrictions Weight Bearing Restrictions: No      Mobility  Bed Mobility Overal bed mobility: Needs Assistance Bed Mobility: Supine to Sit;Sit to Supine     Supine to sit: HOB elevated;Min assist Sit to supine: Min guard   General bed mobility comments: Patient is rather lethargic, required assistance to bring LEs off EOB initially, she used hand rails to return to supine.   Transfers                    Ambulation/Gait                Stairs            Wheelchair Mobility    Modified Rankin (Stroke  Patients Only)       Balance Overall balance assessment: Needs assistance Sitting-balance support: Bilateral upper extremity supported Sitting balance-Leahy Scale: Fair                                       Pertinent Vitals/Pain Pain Assessment:  (She does not comment on any pain in this session.)    Home Living Family/patient expects to be discharged to:: Private residence Living Arrangements: Spouse/significant other Available Help at Discharge: Family Type of Home: House Home Access: Stairs to enter   [employer] of Steps: 3          Prior Function Level of Independence: Independent         Comments: Patient provided very limited feedback, appears she was independent with ambulation prior to hospitalization though multiple, "regular" falls noted.      Hand Dominance        Extremity/Trunk Assessment               Lower Extremity Assessment:  (Did not fully assess due to HR concerns. )         Communication   Communication: No difficulties  Cognition Arousal/Alertness: Lethargic Behavior During Therapy: Flat affect Overall Cognitive Status: Impaired/Different from baseline (Patient appears oriented to being in hospital, however she provides very short answers (  though is generally oriented to life story, family members, pets, etc.))                      General Comments General comments (skin integrity, edema, etc.): Bruising on lips, around her mouth. Bruising noted on forearms, appears related to falls?     Exercises        Assessment/Plan    PT Assessment Patient needs continued PT services  PT Diagnosis Difficulty walking;Generalized weakness   PT Problem List Decreased strength;Decreased knowledge of use of DME;Decreased activity tolerance;Decreased balance;Cardiopulmonary status limiting activity;Decreased cognition  PT Treatment Interventions DME instruction;Gait training;Stair training;Therapeutic  activities;Therapeutic exercise;Balance training   PT Goals (Current goals can be found in the Care Plan section) Acute Rehab PT Goals Patient Stated Goal: To get out of hospital  PT Goal Formulation: With patient Time For Goal Achievement: 07/12/15 Potential to Achieve Goals: Fair    Frequency Min 2X/week   Barriers to discharge        Co-evaluation               End of Session   Activity Tolerance: Treatment limited secondary to medical complications (Comment) (HR of 143 in sitting) Patient left: in bed;with call bell/phone within reach;with bed alarm set Nurse Communication: Mobility status (HR concerns)         Time: [id number] PT Time Calculation (min) (ACUTE ONLY): 12 min   Charges:   PT Evaluation $PT Eval Moderate Complexity: 1 Procedure     PT G Codes:       Kerin RansomPatrick A McNamara, PT, DPT    06/28/2015, 5:33 PM

## 2015-06-28 NOTE — Progress Notes (Signed)
[facility]                                                                                                                                                                                            Patient Demographics   Tanya NapoleonCandy Mercer, is a 50 y.o. female, DOB - [birth year], ZOX:[id number]  Admit date - 06/26/2015   Admitting Physician Arnaldo NatalMichael S Diamond, MD  Outpatient Primary MD for the patient is No primary care provider on file.   LOS - 1  Subjective: Patient currently drowsy from receiving Ativan due to withdrawal. Unable to provide any review of system     Review of Systems:   CONSTITUTIONAL: Sedated  Vitals:   Filed Vitals:   06/27/15 1417 06/27/15 2103 06/28/15 0443 06/28/15 0500  BP: 133/86 129/84 160/98   Pulse: 99 87 127   Temp: 98.2 F (36.8 C) 98.2 F (36.8 C) 97.8 F (36.6 C)   TempSrc: Oral Oral Oral   Resp: 20 18 18    Height:      Weight:    45.45 kg (100 lb 3.2 oz)  SpO2: 100% 100% 100%     Wt Readings from Last 3 Encounters:  06/28/15 45.45 kg (100 lb 3.2 oz)     Intake/Output Summary (Last 24 hours) at 06/28/15 1325 Last data filed at 06/28/15 0741  Gross per 24 hour  Intake 923.33 ml  Output      0 ml  Net 923.33 ml    Physical Exam:   GENERAL: Sedated HEAD, EYES, EARS, NOSE AND THROAT: Atraumatic, normocephalic. Extraocular muscles are intact. Pupils equal and reactive to light. Sclerae anicteric. No conjunctival injection. No oro-pharyngeal erythema.  NECK: Supple. There is no jugular venous distention. No bruits, no lymphadenopathy, no thyromegaly.  HEART: Regular rate and rhythm,. No murmurs, no rubs, no clicks.  LUNGS: Clear to auscultation bilaterally. No rales or rhonchi. No wheezes.  ABDOMEN: Soft, flat, nontender, nondistended. Has good bowel sounds. No hepatosplenomegaly appreciated.  EXTREMITIES: No evidence of any cyanosis, clubbing, or peripheral edema.  +2 pedal and radial  pulses bilaterally.  NEUROLOGIC: Sedated SKIN: Moist and warm with no rashes appreciated.  Psych: Sedated LN: No inguinal LN enlargement    Antibiotics   Anti-infectives    None      Medications   Scheduled Meds: . docusate sodium  100 mg Oral BID  . folic acid  1 mg Oral Daily  . LORazepam  0-4 mg Oral Q6H   Followed by  . [START ON 06/29/2015] LORazepam  0-4 mg Oral Q12H  . multivitamin with minerals  1 tablet Oral Daily  . potassium chloride  40 mEq Oral BID  . sertraline  50 mg Oral Daily  . thiamine  100 mg Oral Daily   Or  . thiamine  100 mg Intravenous Daily  . trazodone  300 mg Oral QHS   Continuous Infusions: . 0.9 % NaCl with KCl 40 mEq / L 100 mL/hr (06/28/15 0839)   PRN Meds:.LORazepam **OR** LORazepam, morphine injection, ondansetron **OR** ondansetron (ZOFRAN) IV   Data Review:   Micro Results Recent Results (from the past 240 hour(s))  C difficile quick scan w PCR reflex     Status: None   Collection Time: 06/27/15  5:30 PM  Result Value Ref Range Status   C Diff antigen NEGATIVE NEGATIVE Final   C Diff toxin NEGATIVE NEGATIVE Final   C Diff interpretation Negative for C. difficile  Final    Radiology Reports Dg Chest 2 View  06/27/2015  CLINICAL DATA:  Acute onset of back and rib bruising. Initial encounter. EXAM: CHEST  2 VIEW COMPARISON:  None. FINDINGS: The lungs are well-aerated and clear. There is no evidence of focal opacification, pleural effusion or pneumothorax. The heart is normal in size; the mediastinal contour is within normal limits. No acute osseous abnormalities are seen. IMPRESSION: No acute cardiopulmonary process seen. No displaced rib fractures identified. Electronically Signed   By: Roanna Raider M.D.   On: 06/27/2015 01:02   Ct Head Wo Contrast  06/27/2015  CLINICAL DATA:  History of drinking. Four 5 falls last week. Bruises to the forehead, face, arms, legs, and throughout the body. Head injury. EXAM: CT HEAD WITHOUT CONTRAST  CT CERVICAL SPINE WITHOUT CONTRAST TECHNIQUE: Multidetector CT imaging of the head and cervical spine was performed following the standard protocol without intravenous contrast. Multiplanar CT image reconstructions of the cervical spine were also generated. COMPARISON:  07/22/2013 FINDINGS: CT HEAD FINDINGS Mild diffuse sulcal prominence suggesting atrophy. No ventricular dilatation. Ventricles and sulci appear symmetrical. No mass effect or midline shift. No abnormal extra-axial fluid collections. Gray-white matter junctions are distinct. Basal cisterns are not effaced. No evidence of acute intracranial hemorrhage. No depressed skull fractures. Bone erosion and expansile change in the superior left temporal bone again demonstrated, similar to previous study. Neoplasm not excluded. As previously suggested, MRI may be useful in further evaluation. No significant change since previous study. Paranasal sinuses and mastoid air cells are not opacified. CT CERVICAL SPINE FINDINGS There is straightening of the usual cervical lordosis. This may be due to patient positioning or degenerative change but ligamentous injury or muscle spasm could also have this appearance and are not excluded. Degenerative changes with narrowed interspaces and endplate hypertrophic changes in the lower cervical spine. Prominent disc osteophyte complex at C4-5 likely causes encroachment upon the central canal. Normal alignment of the posterior facet joints. No anterior subluxation of the cervical vertebrae. No vertebral compression deformities. No prevertebral soft tissue swelling. C1-2 articulation appears intact. Head is tilted towards the left, possibly representing scoliosis or muscle spasm. Soft tissues are unremarkable. IMPRESSION: No acute intracranial abnormalities. Early atrophic changes. 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 Neoplasm remains to be excluded and may consider MR for  further evaluation. Nonspecific straightening of usual cervical lordosis. Degenerative changes in the cervical spine. No acute displaced fractures identified. Electronically Signed   By: Burman Nieves M.D.   On: 06/27/2015 01:09   Ct Cervical Spine Wo Contrast  06/27/2015  CLINICAL DATA:  History of drinking. Four 5 falls last week. Bruises to the forehead, face, arms, legs, and throughout the body. Head injury. EXAM: CT HEAD WITHOUT CONTRAST CT CERVICAL SPINE WITHOUT CONTRAST TECHNIQUE: Multidetector CT imaging of the head and cervical spine was performed following the standard protocol without intravenous contrast. Multiplanar CT image reconstructions of the cervical spine were also generated. COMPARISON:  07/22/2013 FINDINGS: CT HEAD FINDINGS Mild diffuse sulcal prominence suggesting atrophy. No ventricular dilatation. Ventricles and sulci appear symmetrical. No mass effect or midline shift. No abnormal extra-axial fluid collections. Gray-white matter junctions are distinct. Basal cisterns are not effaced. No evidence of acute intracranial hemorrhage. No depressed skull fractures. Bone erosion and expansile change in the superior left temporal bone again demonstrated, similar to previous study. Neoplasm not excluded. As previously suggested, MRI may be useful in further evaluation. No significant change since previous study. Paranasal sinuses and mastoid air cells are not opacified. CT CERVICAL SPINE FINDINGS There is straightening of the usual cervical lordosis. This may be due to patient positioning or degenerative change but ligamentous injury or muscle spasm could also have this appearance and are not excluded. Degenerative changes with narrowed interspaces and endplate hypertrophic changes in the lower cervical spine. Prominent disc osteophyte complex at C4-5 likely causes encroachment upon the central canal. Normal alignment of the posterior facet joints. No anterior subluxation of the cervical  vertebrae. No vertebral compression deformities. No prevertebral soft tissue swelling. C1-2 articulation appears intact. Head is tilted towards the left, possibly representing scoliosis or muscle spasm. Soft tissues are unremarkable. IMPRESSION: No acute intracranial abnormalities. Early atrophic changes. 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 Neoplasm remains to be excluded and may consider MR for further evaluation. Nonspecific straightening of usual cervical lordosis. Degenerative changes in the cervical spine. No acute displaced fractures identified. Electronically Signed   By: Burman Nieves M.D.   On: 06/27/2015 01:09   Dg Humerus Right  06/27/2015  CLINICAL DATA:  Status post falls, with right arm bruising. Initial encounter. EXAM: RIGHT HUMERUS - 2+ VIEW COMPARISON:  None. FINDINGS: There is no evidence of fracture or dislocation. The right humerus appears intact. The right humeral head remains seated at the glenoid fossa. The right acromioclavicular joint is grossly unremarkable. The elbow joint is grossly unremarkable in appearance. The visualized portions of the right lung are clear. IMPRESSION: No evidence of fracture or dislocation. Electronically Signed   By: Roanna Raider M.D.   On: 06/27/2015 01:03   Dg Femur, Min 2 Views Right  06/27/2015  CLINICAL DATA:  Long history of drinking. Multiple falls over the last week. Bruising to the legs. EXAM: RIGHT FEMUR 2 VIEWS COMPARISON:  None. FINDINGS: There is no evidence of fracture or other focal bone lesions. Soft tissues are unremarkable. IMPRESSION: Negative. Electronically Signed   By: Burman Nieves M.D.   On: 06/27/2015 01:37     CBC  Recent Labs Lab 06/26/15 2237  WBC 3.6  HGB 12.3  HCT 34.9*  PLT 42*  MCV 100.2*  MCH 35.2*  MCHC 35.2  RDW 12.6    Chemistries   Recent Labs Lab 06/26/15 2237 06/28/15 0414  NA 127* 133*  K 2.9* 4.0  CL 84* 101  CO2 23 19*   GLUCOSE 91 61*  BUN <5* <5*  CREATININE 0.39* 0.42*  CALCIUM 8.5* 8.6*  AST 140*  --   ALT 46  --   ALKPHOS 66  --   BILITOT 1.1  --    ------------------------------------------------------------------------------------------------------------------  estimated creatinine clearance is 61.8 mL/min (by C-G formula based on Cr of 0.42). ------------------------------------------------------------------------------------------------------------------  Recent Labs  06/26/15 2337  HGBA1C 4.5   ------------------------------------------------------------------------------------------------------------------ No results for input(s): CHOL, HDL, LDLCALC, TRIG, CHOLHDL, LDLDIRECT in the last 72 hours. ------------------------------------------------------------------------------------------------------------------  Recent Labs  06/26/15 2337  TSH 2.155   ------------------------------------------------------------------------------------------------------------------ No results for input(s): VITAMINB12, FOLATE, FERRITIN, TIBC, IRON, RETICCTPCT in the last 72 hours.  Coagulation profile No results for input(s): INR, PROTIME in the last 168 hours.  No results for input(s): DDIMER in the last 72 hours.  Cardiac Enzymes No results for input(s): CKMB, TROPONINI, MYOGLOBIN in the last 168 hours.  Invalid input(s): CK ------------------------------------------------------------------------------------------------------------------ Invalid input(s): POCBNP    Assessment & Plan   AThis is a 50 year old female admitted for alcohol intoxication, hyponatremia and thrombocytopenia. 1. Alcohol abuse: Continue all withdrawal protocol 2. Hyponatremia: Secondary to alcohol intake improved with normal saline infusion  3. Thrombocytopenia: Secondary to alcohol abuse. No bleeding diatheses.  4. Hypokalemia potassium replaced 5. Depression: Continue sertraline 6. DVT prophylaxis: SCDs 7. GI  prophylaxis: None      Code Status Orders        Start     Ordered   06/27/15 0316  Full code   Continuous     06/27/15 0316    Code Status History    Date Active Date Inactive Code Status Order ID Comments User Context   This patient has a current code status but no historical code status.           Consults None DVT Prophylaxis  SCDs  Lab Results  Component Value Date   PLT 42* 06/26/2015     Time Spent in minutes35 minutes  Greater than 50% of time spent in care coordination and counseling patient regarding the condition and plan of care.   Auburn Bilberry M.D on 06/28/2015 at 1:25 PM  Between 7am to 6pm - Pager - [phone]  After 6pm go to [email or web address] - password [email or web address] [facility]  [facility] [place] [facility]   Office  [phone]

## 2015-06-28 NOTE — Consult Note (Signed)
[facility] Consult   Reason for Consult:  Consult for 50 year old woman with history of alcohol abuse currently in the hospital because of multiple falls Referring Physician:  Posey Pronto Patient Identification: Tanya Mercer MRN:  [id number] Principal Diagnosis: Alcohol abuse Diagnosis:   Patient Active Problem List   Diagnosis Date Noted  . Thrombocytopenia ([place]) [D69.6] 06/27/2015  . Alcohol abuse [F10.10] 06/27/2015    Total Time spent with patient: 45 minutes  Subjective:   Tanya Mercer is a 50 y.o. female patient admitted with "I've been falling a lot".  HPI:  Patient interviewed. Chart reviewed. Husband and daughter also present. Labs and vitals reviewed. 50 year old woman with a long history of alcohol abuse and depression comes into the hospital because of increasing problems with falls at home. Patient tells me she's falling multiple times a day. She is quite aware of the fact that this is a direct consequence of her heavy alcohol consumption. Patient says that she is drinking between 12 and 24 beers every day and has been doing so steadily for most of the last 10 years with only occasional rakes. She is not abusing any other drugs. Patient has not stopped long enough recently to know whether she's ever had seizures or DTs. She has been losing weight. Does not eat well. Loses balance frequently. Mood is staying down and depressed but without any suicidal thoughts. No hallucinations no psychosis. She is compliant with her prescribed antidepressant medicine. Patient expresses a desire to stop drinking and a willingness to engage in treatment.  Social history: Lives at home with her husband. Has a daughter who is actively involved in trying to get treatment for her as well.  Medical history: Patient has multiple lab abnormalities consistent with heavy alcohol use including severe thrombocytopenia anemia with elevated MCV abnormal chemistry panel. She has lost a great deal  of weight. She has bruises all over her face from multiple falls.  Substance abuse history: Long-standing alcohol abuse. She has been to rehabilitation treatment in the past but it's been several years ago. Longest sobriety as an adult is about 80 days at a time.  Past Psychiatric History: As treated as an outpatient for depression taking Zoloft and trazodone. No history of suicide attempts. She does have a history of previous admissions for alcohol detox.  Risk to Self: Is patient at risk for suicide?: No Risk to Others:   Prior Inpatient Therapy:   Prior Outpatient Therapy:    Past Medical History:  Past Medical History  Diagnosis Date  . Alcohol abuse   . Depression     Past Surgical History  Procedure Laterality Date  . Abdominal hysterectomy     Family History:  Family History  Problem Relation Age of Onset  . Diabetes Mellitus II Mother    Family Psychiatric  History: Family history positive for anxiety and depression Social History:  History  Alcohol Use  . 7.2 oz/week  . 12 Cans of beer per week     History  Drug Use Not on file    Social History   Social History  . Marital Status: Married    Spouse Name: N/A  . Number of Children: N/A  . Years of Education: N/A   Social History Main Topics  . Smoking status: Current Every Day Smoker  . Smokeless tobacco: None  . Alcohol Use: 7.2 oz/week    12 Cans of beer per week  . Drug Use: None  . Sexual Activity: Not Asked  Other Topics Concern  . None   Social History Narrative  . None   Additional Social History:    Allergies:  No Known Allergies  Labs:  Results for orders placed or performed during the hospital encounter of 06/26/15 (from the past 48 hour(s))  Comprehensive metabolic panel     Status: Abnormal   Collection Time: 06/26/15 10:37 PM  Result Value Ref Range   Sodium 127 (L) 135 - 145 mmol/L   Potassium 2.9 (LL) 3.5 - 5.1 mmol/L    Comment: CRITICAL RESULT CALLED TO, READ BACK BY AND  VERIFIED WITH JENNA ELLINGTON AT 2336 06/26/15.PMH   Chloride 84 (L) 101 - 111 mmol/L   CO2 23 22 - 32 mmol/L   Glucose, Bld 91 65 - 99 mg/dL   BUN <5 (L) 6 - 20 mg/dL   Creatinine, Ser 0.39 (L) 0.44 - 1.00 mg/dL   Calcium 8.5 (L) 8.9 - 10.3 mg/dL   Total Protein 7.4 6.5 - 8.1 g/dL   Albumin 4.4 3.5 - 5.0 g/dL   AST 140 (H) 15 - 41 U/L   ALT 46 14 - 54 U/L   Alkaline Phosphatase 66 38 - 126 U/L   Total Bilirubin 1.1 0.3 - 1.2 mg/dL   GFR calc non Af Amer >60 >60 mL/min   GFR calc Af Amer >60 >60 mL/min    Comment: (NOTE) The eGFR has been calculated using the CKD EPI equation. This calculation has not been validated in all clinical situations. eGFR's persistently <60 mL/min signify possible Chronic Kidney Disease.    Anion gap 20 (H) 5 - 15  Ethanol     Status: Abnormal   Collection Time: 06/26/15 10:37 PM  Result Value Ref Range   Alcohol, Ethyl (B) 454 (HH) <5 mg/dL    Comment: CRITICAL RESULT CALLED TO, READ BACK BY AND VERIFIED WITH JENNA ELLINGTON AT 2336  06/26/15.PMH        LOWEST DETECTABLE LIMIT FOR SERUM ALCOHOL IS 5 mg/dL FOR MEDICAL PURPOSES ONLY   cbc     Status: Abnormal   Collection Time: 06/26/15 10:37 PM  Result Value Ref Range   WBC 3.6 3.6 - 11.0 K/uL   RBC 3.48 (L) 3.80 - 5.20 MIL/uL   Hemoglobin 12.3 12.0 - 16.0 g/dL   HCT 34.9 (L) 35.0 - 47.0 %   MCV 100.2 (H) 80.0 - 100.0 fL   MCH 35.2 (H) 26.0 - 34.0 pg   MCHC 35.2 32.0 - 36.0 g/dL   RDW 12.6 11.5 - 14.5 %   Platelets 42 (L) 150 - 440 K/uL  Urine Drug Screen, Qualitative     Status: None   Collection Time: 06/26/15 10:37 PM  Result Value Ref Range   Tricyclic, Ur Screen NONE DETECTED NONE DETECTED   Amphetamines, Ur Screen NONE DETECTED NONE DETECTED   MDMA (Ecstasy)Ur Screen NONE DETECTED NONE DETECTED   Cocaine Metabolite,Ur [place] NONE DETECTED NONE DETECTED   Opiate, Ur Screen NONE DETECTED NONE DETECTED   Phencyclidine (PCP) Ur S NONE DETECTED NONE DETECTED   Cannabinoid 50 Ng, Ur [place] NONE  DETECTED NONE DETECTED   Barbiturates, Ur Screen NONE DETECTED NONE DETECTED   Benzodiazepine, Ur Scrn NONE DETECTED NONE DETECTED   Methadone Scn, Ur NONE DETECTED NONE DETECTED    Comment: (NOTE) 979  Tricyclics, urine               Cutoff 1000 ng/mL 200  Amphetamines, urine  Cutoff 1000 ng/mL 300  MDMA (Ecstasy), urine           Cutoff 500 ng/mL 400  Cocaine Metabolite, urine       Cutoff 300 ng/mL 500  Opiate, urine                   Cutoff 300 ng/mL 600  Phencyclidine (PCP), urine      Cutoff 25 ng/mL 700  Cannabinoid, urine              Cutoff 50 ng/mL 800  Barbiturates, urine             Cutoff 200 ng/mL 900  Benzodiazepine, urine           Cutoff 200 ng/mL 1000 Methadone, urine                Cutoff 300 ng/mL 1100 1200 The urine drug screen provides only a preliminary, unconfirmed 1300 analytical test result and should not be used for non-medical 1400 purposes. Clinical consideration and professional judgment should 1500 be applied to any positive drug screen result due to possible 1600 interfering substances. A more specific alternate chemical method 1700 must be used in order to obtain a confirmed analytical result.  1800 Gas chromato graphy / mass spectrometry (GC/MS) is the preferred 1900 confirmatory method.   Salicylate level     Status: None   Collection Time: 06/26/15 10:37 PM  Result Value Ref Range   Salicylate Lvl <5.3 2.8 - 30.0 mg/dL  Acetaminophen level     Status: Abnormal   Collection Time: 06/26/15 10:37 PM  Result Value Ref Range   Acetaminophen (Tylenol), Serum <10 (L) 10 - 30 ug/mL    Comment:        THERAPEUTIC CONCENTRATIONS VARY SIGNIFICANTLY. A RANGE OF 10-30 ug/mL MAY BE AN EFFECTIVE CONCENTRATION FOR MANY PATIENTS. HOWEVER, SOME ARE BEST TREATED AT CONCENTRATIONS OUTSIDE THIS RANGE. ACETAMINOPHEN CONCENTRATIONS >150 ug/mL AT 4 HOURS AFTER INGESTION AND >50 ug/mL AT 12 HOURS AFTER INGESTION ARE OFTEN ASSOCIATED WITH  TOXIC REACTIONS.   Phosphorus     Status: Abnormal   Collection Time: 06/26/15 10:37 PM  Result Value Ref Range   Phosphorus 1.9 (L) 2.5 - 4.6 mg/dL  TSH     Status: None   Collection Time: 06/26/15 11:37 PM  Result Value Ref Range   TSH 2.155 0.350 - 4.500 uIU/mL  Hemoglobin A1c     Status: None   Collection Time: 06/26/15 11:37 PM  Result Value Ref Range   Hgb A1c MFr Bld 4.5 4.0 - 6.0 %  C difficile quick scan w PCR reflex     Status: None   Collection Time: 06/27/15  5:30 PM  Result Value Ref Range   C Diff antigen NEGATIVE NEGATIVE   C Diff toxin NEGATIVE NEGATIVE   C Diff interpretation Negative for C. difficile   Basic metabolic panel     Status: Abnormal   Collection Time: 06/28/15  4:14 AM  Result Value Ref Range   Sodium 133 (L) 135 - 145 mmol/L   Potassium 4.0 3.5 - 5.1 mmol/L   Chloride 101 101 - 111 mmol/L   CO2 19 (L) 22 - 32 mmol/L   Glucose, Bld 61 (L) 65 - 99 mg/dL   BUN <5 (L) 6 - 20 mg/dL   Creatinine, Ser 0.42 (L) 0.44 - 1.00 mg/dL   Calcium 8.6 (L) 8.9 - 10.3 mg/dL   GFR calc non Af Amer >60 >60 mL/min  GFR calc Af Amer >60 >60 mL/min    Comment: (NOTE) The eGFR has been calculated using the CKD EPI equation. This calculation has not been validated in all clinical situations. eGFR's persistently <60 mL/min signify possible Chronic Kidney Disease.    Anion gap 13 5 - 15    Current Facility-Administered Medications  Medication Dose Route Frequency Provider Last Rate Last Dose  . 0.9 % NaCl with KCl 40 mEq / L  infusion   Intravenous Continuous Harrie Foreman, MD 100 mL/hr at 06/28/15 0839 100 mL/hr at 06/28/15 0839  . docusate sodium (COLACE) capsule 100 mg  100 mg Oral BID Harrie Foreman, MD   100 mg at 06/28/15 7124  . folic acid (FOLVITE) tablet 1 mg  1 mg Oral Daily Harrie Foreman, MD   1 mg at 06/28/15 5809  . LORazepam (ATIVAN) tablet 1 mg  1 mg Oral Q6H PRN Harrie Foreman, MD   1 mg at 06/28/15 9833   Or  . LORazepam (ATIVAN)  injection 1 mg  1 mg Intravenous Q6H PRN Harrie Foreman, MD      . LORazepam (ATIVAN) tablet 0-4 mg  0-4 mg Oral Q6H Harrie Foreman, MD   4 mg at 06/28/15 1542   Followed by  . [START ON 06/29/2015] LORazepam (ATIVAN) tablet 0-4 mg  0-4 mg Oral Q12H Harrie Foreman, MD      . multivitamin with minerals tablet 1 tablet  1 tablet Oral Daily Harrie Foreman, MD   1 tablet at 06/28/15 [phone]  . ondansetron (ZOFRAN) tablet 4 mg  4 mg Oral Q6H PRN Harrie Foreman, MD       Or  . ondansetron [facility]) injection 4 mg  4 mg Intravenous Q6H PRN Harrie Foreman, MD      . sertraline (ZOLOFT) tablet 50 mg  50 mg Oral Daily Harrie Foreman, MD   50 mg at 06/28/15 0839  . thiamine (VITAMIN B-1) tablet 100 mg  100 mg Oral Daily Harrie Foreman, MD   100 mg at 06/28/15 5397   Or  . thiamine (B-1) injection 100 mg  100 mg Intravenous Daily Harrie Foreman, MD      . traZODone (DESYREL) tablet 300 mg  300 mg Oral QHS Harrie Foreman, MD   300 mg at 06/27/15 2155    Musculoskeletal: Strength & Muscle Tone: decreased Gait & Station: unable to stand Patient leans: N/A  Psychiatric Specialty Exam: Physical Exam  Nursing note and vitals reviewed. Constitutional: She appears well-developed and well-nourished.  HENT:  Head: Normocephalic and atraumatic.  Eyes: Conjunctivae are normal. Pupils are equal, round, and reactive to light.  Neck: Normal range of motion.  Cardiovascular: Normal heart sounds.   Respiratory: Effort normal. No respiratory distress.  GI: Soft.  Musculoskeletal: Normal range of motion.  Neurological: She is alert.  Skin: Skin is warm and dry.  Psychiatric: Judgment and thought content normal. Her mood appears anxious. Her affect is blunt. Her speech is delayed. She is slowed. Cognition and memory are normal.    Review of Systems  HENT: Negative.   Eyes: Negative.   Respiratory: Negative.   Cardiovascular: Negative.   Gastrointestinal: Negative.    Musculoskeletal: Positive for falls.  Skin: Negative.   Neurological: Positive for weakness.  Psychiatric/Behavioral: Positive for depression and substance abuse. Negative for suicidal ideas, hallucinations and memory loss. The patient is not nervous/anxious and does not have insomnia.  Blood pressure 125/81, pulse 143, temperature 98.4 F (36.9 C), temperature source Oral, resp. rate 18, height 5' 1"  (1.549 m), weight 45.45 kg (100 lb 3.2 oz), SpO2 100 %.Body mass index is 18.94 kg/(m^2).  General Appearance: Disheveled  Eye Contact:  Good  Speech:  Slow  Volume:  Decreased  Mood:  Dysphoric  Affect:  Constricted  Thought Process:  Goal Directed  Orientation:  Full (Time, Place, and Person)  Thought Content:  Negative  Suicidal Thoughts:  No  Homicidal Thoughts:  No  Memory:  Immediate;   Good Recent;   Fair Remote;   Fair  Judgement:  Fair  Insight:  Fair  Psychomotor Activity:  Decreased  Concentration:  Concentration: Fair  Recall:  [employer] of Knowledge:  Fair  Language:  Fair  Akathisia:  No  Handed:  Right  AIMS (if indicated):     Assets:  Desire for Improvement Housing Intimacy Resilience  ADL's:  Impaired  Cognition:  Impaired,  Mild  Sleep:        Treatment Plan Summary: Daily contact with patient to assess and evaluate symptoms and progress in treatment, Medication management and Plan 50 year old woman with alcohol dependence currently going through alcohol withdrawal. She is grossly tremulous in her hands to the point of needing assistance eating. Her pulse is staying well over 100. Nevertheless she is not having visual hallucinations and does not appear to be delirious. Patient is expressing an interest in actually engaging in treatment and shows an understanding of the severity of her illness. It sounds like the family is Artie working on getting her referred to residential treatment services. Continue the current detox protocol. I will follow-up  regularly.  Disposition: Patient does not meet criteria for psychiatric inpatient admission. Supportive therapy provided about ongoing stressors.  Alethia Berthold, MD 06/28/2015 8:55 PM

## 2015-06-29 MED ORDER — LORAZEPAM 2 MG/ML IJ SOLN
2.0000 mg | Freq: Once | INTRAMUSCULAR | Status: AC
  Administered 2015-06-29: 18:00:00 2 mg via INTRAVENOUS
  Filled 2015-06-29: qty 1

## 2015-06-29 NOTE — Progress Notes (Signed)
Physical Therapy Treatment Patient Details Name: Tanya Mercer MRN: [id number] DOB: 10/15/1965 Today's Date: 06/29/2015    History of Present Illness Patient is a 50 y/o female admitted after multiple falls, with thrombocytopenia. Currently reported to be withdrawing from alcohol.     PT Comments    Pt very tremulous this session and required Min A for bed mobility transfers and gait.  Pt only able to ambulate short distance to sink and required Min A for balance during hand hygeine task.  Pt required hand over hand for hand washing although was able to initiate getting soap and turning on water, but did not have enough functional reach to complete.  Pt limited by tremors which increased with fatigue and pt returned to bed and settled into a restful position.  Bed alarm on most sensitive setting due to report of pt trying to get up constantly.   Follow Up Recommendations  SNF     Equipment Recommendations  Rolling walker with 5" wheels    Recommendations for Other Services       Precautions / Restrictions Precautions Precautions: Fall Restrictions Weight Bearing Restrictions: No    Mobility  Bed Mobility Overal bed mobility: Needs Assistance Bed Mobility: Supine to Sit;Sit to Supine     Supine to sit: HOB elevated;Min assist Sit to supine: Min assist   General bed mobility comments: increased posterior lean in sitting; Min A to scoot to EOB  Transfers Overall transfer level: Needs assistance Equipment used: Rolling walker (2 wheeled) Transfers: Sit to/from Stand Sit to Stand: Min guard         General transfer comment: B hands on RW, slow lift off from bed; increasing tremors in UE's and LE's with all movment  Ambulation/Gait Ambulation/Gait assistance: Min assist Ambulation Distance (Feet): 5 Feet Assistive device: Rolling walker (2 wheeled) Gait Pattern/deviations: Step-to pattern;Shuffle;Leaning posteriorly;Narrow base of support Gait velocity: slow Gait  velocity interpretation: Below normal speed for age/gender General Gait Details: Slow tremulous gait with poor balance and posterior lean; decreased proprioception and depth perception as approching counter   Stairs            Wheelchair Mobility    Modified Rankin (Stroke Patients Only)       Balance Overall balance assessment: Needs assistance Sitting-balance support: Bilateral upper extremity supported;Feet supported Sitting balance-Leahy Scale: Poor   Postural control: Posterior lean Standing balance support: Bilateral upper extremity supported;During functional activity Standing balance-Leahy Scale: Poor Standing balance comment: posterior lean and tremors                    Cognition Arousal/Alertness: Lethargic Behavior During Therapy: Flat affect Overall Cognitive Status: Impaired/Different from baseline Area of Impairment: Safety/judgement;Awareness;Problem solving         Safety/Judgement: Decreased awareness of safety;Decreased awareness of deficits Awareness: Anticipatory Problem Solving: Slow processing;Decreased initiation General Comments: Decreased arousal    Exercises      General Comments        Pertinent Vitals/Pain Pain Assessment:  (no c/o)    Home Living                      Prior Function            PT Goals (current goals can now be found in the care plan section) Acute Rehab PT Goals Patient Stated Goal: To get out of hospital  PT Goal Formulation: With patient Time For Goal Achievement: 07/12/15 Potential to Achieve Goals: Fair Progress towards PT  goals: Progressing toward goals    Frequency  Min 2X/week    PT Plan Current plan remains appropriate    Co-evaluation             End of Session Equipment Utilized During Treatment: Gait belt Activity Tolerance: Patient limited by fatigue;Patient limited by lethargy Patient left: in bed;with call bell/phone within reach;with bed alarm set      Time: [id number] PT Time Calculation (min) (ACUTE ONLY): 29 min  Charges:  $Gait Training: 8-22 mins $Therapeutic Activity: 8-22 mins                    G Codes:      Jonell Krontz A Cashis Rill, PT 06/29/2015, 1:33 PM

## 2015-06-29 NOTE — Progress Notes (Signed)
Patient experiencing hallucinations, anxiety, tremors and refusing to stay in bed - continues to ask for cigarettes and beer. 1mg  IV Ativan given as ordered. Bo McclintockBrewer,Angela Platner S, RN

## 2015-06-29 NOTE — Consult Note (Signed)
[facility] Consult   Reason for Consult:  Consult for 50 year old woman with history of alcohol abuse currently in the hospital because of multiple falls Referring Physician:  Posey Pronto Patient Identification: Tanya Mercer MRN:  [id number] Principal Diagnosis: Alcohol abuse Diagnosis:   Patient Active Problem List   Diagnosis Date Noted  . Thrombocytopenia ([place]) [D69.6] 06/27/2015  . Alcohol abuse [F10.10] 06/27/2015    Total Time spent with patient: 45 minutes  Subjective:   Tanya Mercer is a 51 y.o. female patient admitted with "I've been falling a lot".  Follow-up today Tuesday the 23rd. Patient seen. Chart reviewed. When I came to see her this afternoon she was frankly delirious. She was hallucinating caterpillar's on her window and was trying to knock them off with a straw even though she could not even reach the window with straw. Talking about how she didn't want them in her house. She was able to interact with me slightly but still very confused. Blood pressure okay pulse up a little bit. Not clear if she's been eating very well. Very tremulous all over. Couldn't hold a cup to drink out of it.  HPI:  Patient interviewed. Chart reviewed. Husband and daughter also present. Labs and vitals reviewed. 50 year old woman with a long history of alcohol abuse and depression comes into the hospital because of increasing problems with falls at home. Patient tells me she's falling multiple times a day. She is quite aware of the fact that this is a direct consequence of her heavy alcohol consumption. Patient says that she is drinking between 12 and 24 beers every day and has been doing so steadily for most of the last 10 years with only occasional rakes. She is not abusing any other drugs. Patient has not stopped long enough recently to know whether she's ever had seizures or DTs. She has been losing weight. Does not eat well. Loses balance frequently. Mood is staying down and  depressed but without any suicidal thoughts. No hallucinations no psychosis. She is compliant with her prescribed antidepressant medicine. Patient expresses a desire to stop drinking and a willingness to engage in treatment.  Social history: Lives at home with her husband. Has a daughter who is actively involved in trying to get treatment for her as well.  Medical history: Patient has multiple lab abnormalities consistent with heavy alcohol use including severe thrombocytopenia anemia with elevated MCV abnormal chemistry panel. She has lost a great deal of weight. She has bruises all over her face from multiple falls.  Substance abuse history: Long-standing alcohol abuse. She has been to rehabilitation treatment in the past but it's been several years ago. Longest sobriety as an adult is about 80 days at a time.  Past Psychiatric History: As treated as an outpatient for depression taking Zoloft and trazodone. No history of suicide attempts. She does have a history of previous admissions for alcohol detox.  Risk to Self: Is patient at risk for suicide?: No Risk to Others:   Prior Inpatient Therapy:   Prior Outpatient Therapy:    Past Medical History:  Past Medical History  Diagnosis Date  . Alcohol abuse   . Depression     Past Surgical History  Procedure Laterality Date  . Abdominal hysterectomy     Family History:  Family History  Problem Relation Age of Onset  . Diabetes Mellitus II Mother    Family Psychiatric  History: Family history positive for anxiety and depression Social History:  History  Alcohol Use  .  7.2 oz/week  . 12 Cans of beer per week     History  Drug Use Not on file    Social History   Social History  . Marital Status: Married    Spouse Name: N/A  . Number of Children: N/A  . Years of Education: N/A   Social History Main Topics  . Smoking status: Current Every Day Smoker  . Smokeless tobacco: None  . Alcohol Use: 7.2 oz/week    12 Cans of beer  per week  . Drug Use: None  . Sexual Activity: Not Asked   Other Topics Concern  . None   Social History Narrative  . None   Additional Social History:    Allergies:  No Known Allergies  Labs:  Results for orders placed or performed during the hospital encounter of 06/26/15 (from the past 48 hour(s))  Basic metabolic panel     Status: Abnormal   Collection Time: 06/28/15  4:14 AM  Result Value Ref Range   Sodium 133 (L) 135 - 145 mmol/L   Potassium 4.0 3.5 - 5.1 mmol/L   Chloride 101 101 - 111 mmol/L   CO2 19 (L) 22 - 32 mmol/L   Glucose, Bld 61 (L) 65 - 99 mg/dL   BUN <5 (L) 6 - 20 mg/dL   Creatinine, Ser 0.42 (L) 0.44 - 1.00 mg/dL   Calcium 8.6 (L) 8.9 - 10.3 mg/dL   GFR calc non Af Amer >60 >60 mL/min   GFR calc Af Amer >60 >60 mL/min    Comment: (NOTE) The eGFR has been calculated using the CKD EPI equation. This calculation has not been validated in all clinical situations. eGFR's persistently <60 mL/min signify possible Chronic Kidney Disease.    Anion gap 13 5 - 15    Current Facility-Administered Medications  Medication Dose Route Frequency Provider Last Rate Last Dose  . 0.9 % NaCl with KCl 40 mEq / L  infusion   Intravenous Continuous Harrie Foreman, MD 100 mL/hr at 06/29/15 1636 100 mL/hr at 06/29/15 1636  . docusate sodium (COLACE) capsule 100 mg  100 mg Oral BID Harrie Foreman, MD   100 mg at 06/28/15 5366  . folic acid (FOLVITE) tablet 1 mg  1 mg Oral Daily Harrie Foreman, MD   1 mg at 06/29/15 0957  . LORazepam (ATIVAN) tablet 1 mg  1 mg Oral Q6H PRN Harrie Foreman, MD   1 mg at 06/29/15 0957   Or  . LORazepam (ATIVAN) injection 1 mg  1 mg Intravenous Q6H PRN Harrie Foreman, MD   1 mg at 06/29/15 1609  . LORazepam (ATIVAN) injection 2 mg  2 mg Intravenous Once Gonzella Lex, MD      . multivitamin with minerals tablet 1 tablet  1 tablet Oral Daily Harrie Foreman, MD   1 tablet at 06/29/15 0957  . nicotine (NICODERM CQ - dosed in mg/24  hours) patch 21 mg  21 mg Transdermal Daily Nicholes Mango, MD   21 mg at 06/29/15 0958  . ondansetron (ZOFRAN) tablet 4 mg  4 mg Oral Q6H PRN Harrie Foreman, MD       Or  . ondansetron [facility]) injection 4 mg  4 mg Intravenous Q6H PRN Harrie Foreman, MD      . sertraline (ZOLOFT) tablet 50 mg  50 mg Oral Daily Harrie Foreman, MD   50 mg at 06/29/15 0957  . thiamine (VITAMIN B-1) tablet 100 mg  100 mg Oral Daily Harrie Foreman, MD   100 mg at 06/29/15 3016   Or  . thiamine (B-1) injection 100 mg  100 mg Intravenous Daily Harrie Foreman, MD      . traZODone (DESYREL) tablet 300 mg  300 mg Oral QHS Harrie Foreman, MD   300 mg at 06/28/15 2121    Musculoskeletal: Strength & Muscle Tone: decreased Gait & Station: unable to stand Patient leans: N/A  Psychiatric Specialty Exam: Physical Exam  Nursing note and vitals reviewed. Constitutional: She appears well-developed and well-nourished.  HENT:  Head: Normocephalic and atraumatic.  Eyes: Conjunctivae are normal. Pupils are equal, round, and reactive to light.  Neck: Normal range of motion.  Cardiovascular: Normal heart sounds.   Respiratory: Effort normal. No respiratory distress.  GI: Soft.  Musculoskeletal: Normal range of motion.  Neurological: She is alert.  Skin: Skin is warm and dry.  Psychiatric: Judgment and thought content normal. Her mood appears anxious. Her affect is blunt. Her speech is delayed. She is slowed. Cognition and memory are normal.    Review of Systems  Unable to perform ROS: psychiatric disorder    Blood pressure 117/85, pulse 102, temperature 98.6 F (37 C), temperature source Oral, resp. rate 18, height _0  (1.549 m), weight 45.133 kg (99 lb 8 oz), SpO2 100 %.Body mass index is 18.81 kg/(m^2).  General Appearance: Disheveled  Eye Contact:  Good  Speech:  Slow  Volume:  Decreased  Mood:  Dysphoric  Affect:  Constricted  Thought Process:  Disorganized and Irrelevant  Orientation:   Negative  Thought Content:  Negative  Suicidal Thoughts:  No  Homicidal Thoughts:  No  Memory:  Negative  Judgement:  Fair  Insight:  Fair  Psychomotor Activity:  Decreased  Concentration:  Concentration: Poor  Recall:  [place] of Knowledge:  Fair  Language:  Fair  Akathisia:  No  Handed:  Right  AIMS (if indicated):     Assets:  Desire for Improvement Housing Intimacy Resilience  ADL's:  Impaired  Cognition:  Impaired,  Mild  Sleep:        Treatment Plan Summary: Daily contact with patient to assess and evaluate symptoms and progress in treatment, Medication management and Plan Patient is delirious and tremulous today. Clear-cut DTs. Fortunately not trying to hurt her self but I am worried about whether she might try to get out of the bed. I'm going to go ahead and order an extra 1 time dose of Ativan 2 mg. Medically she is otherwise stable although she is not probably able to eat very much in her current condition. Needs to continue on CIWA protocol. Follow-up tomorrow to see if she is starting to get better. If things get worse and she gets more agitated could be a candidate for Precedex but for now seems to be stable with Ativan.  Disposition: Patient does not meet criteria for psychiatric inpatient admission. Supportive therapy provided about ongoing stressors.  Alethia Berthold, MD 06/29/2015 5:46 PM

## 2015-06-29 NOTE — Progress Notes (Signed)
[facility] - [place] at [facility]                                                                                                                                                                                            Patient Demographics   Tanya Mercer, is a 50 y.o. female, DOB - 04-17-1965, ZOX:[id number]  Admit date - 06/26/2015   Admitting Physician Arnaldo Natal, MD  Outpatient Primary MD for the patient is No primary care provider on file.   LOS - 2  Subjective: Did not receive Ativan last night but today had to get Ativan currently drowsy   Review of Systems:   CONSTITUTIONAL: Sedated  Vitals:   Filed Vitals:   06/28/15 1727 06/28/15 2104 06/29/15 0449 06/29/15 0500  BP:  129/84 107/77   Pulse: 143 89 89   Temp:  98.8 F (37.1 C) 98.1 F (36.7 C)   TempSrc:  Oral Oral   Resp:  19 21   Height:      Weight:   45.813 kg (101 lb) 45.133 kg (99 lb 8 oz)  SpO2:  100% 97%     Wt Readings from Last 3 Encounters:  06/29/15 45.133 kg (99 lb 8 oz)     Intake/Output Summary (Last 24 hours) at 06/29/15 1331 Last data filed at 06/29/15 0900  Gross per 24 hour  Intake    120 ml  Output      0 ml  Net    120 ml    Physical Exam:   GENERAL: Sedated HEAD, EYES, EARS, NOSE AND THROAT: Atraumatic, normocephalic. Extraocular muscles are intact. Pupils equal and reactive to light. Sclerae anicteric. No conjunctival injection. No oro-pharyngeal erythema.  NECK: Supple. There is no jugular venous distention. No bruits, no lymphadenopathy, no thyromegaly.  HEART: Regular rate and rhythm,. No murmurs, no rubs, no clicks.  LUNGS: Clear to auscultation bilaterally. No rales or rhonchi. No wheezes.  ABDOMEN: Soft, flat, nontender, nondistended. Has good bowel sounds. No hepatosplenomegaly appreciated.  EXTREMITIES: No evidence of any cyanosis, clubbing, or peripheral edema.  +2 pedal and radial pulses bilaterally.  NEUROLOGIC: Sedated SKIN:  Moist and warm with no rashes appreciated.  Psych: Sedated LN: No inguinal LN enlargement    Antibiotics   Anti-infectives    None      Medications   Scheduled Meds: . docusate sodium  100 mg Oral BID  . folic acid  1 mg Oral Daily  . multivitamin with minerals  1 tablet Oral Daily  . nicotine  21 mg Transdermal Daily  . sertraline  50 mg Oral Daily  .  thiamine  100 mg Oral Daily   Or  . thiamine  100 mg Intravenous Daily  . trazodone  300 mg Oral QHS   Continuous Infusions: . 0.9 % NaCl with KCl 40 mEq / L 100 mL/hr (06/29/15 0737)   PRN Meds:.LORazepam **OR** LORazepam, ondansetron **OR** ondansetron (ZOFRAN) IV   Data Review:   Micro Results Recent Results (from the past 240 hour(s))  C difficile quick scan w PCR reflex     Status: None   Collection Time: 06/27/15  5:30 PM  Result Value Ref Range Status   C Diff antigen NEGATIVE NEGATIVE Final   C Diff toxin NEGATIVE NEGATIVE Final   C Diff interpretation Negative for C. difficile  Final    Radiology Reports Dg Chest 2 View  06/27/2015  CLINICAL DATA:  Acute onset of back and rib bruising. Initial encounter. EXAM: CHEST  2 VIEW COMPARISON:  None. FINDINGS: The lungs are well-aerated and clear. There is no evidence of focal opacification, pleural effusion or pneumothorax. The heart is normal in size; the mediastinal contour is within normal limits. No acute osseous abnormalities are seen. IMPRESSION: No acute cardiopulmonary process seen. No displaced rib fractures identified. Electronically Signed   By: Roanna RaiderJeffery  Chang M.D.   On: 06/27/2015 01:02   Ct Head Wo Contrast  06/27/2015  CLINICAL DATA:  History of drinking. Four 5 falls last week. Bruises to the forehead, face, arms, legs, and throughout the body. Head injury. EXAM: CT HEAD WITHOUT CONTRAST CT CERVICAL SPINE WITHOUT CONTRAST TECHNIQUE: Multidetector CT imaging of the head and cervical spine was performed following the standard protocol without intravenous  contrast. Multiplanar CT image reconstructions of the cervical spine were also generated. COMPARISON:  07/22/2013 FINDINGS: CT HEAD FINDINGS Mild diffuse sulcal prominence suggesting atrophy. No ventricular dilatation. Ventricles and sulci appear symmetrical. No mass effect or midline shift. No abnormal extra-axial fluid collections. Gray-white matter junctions are distinct. Basal cisterns are not effaced. No evidence of acute intracranial hemorrhage. No depressed skull fractures. Bone erosion and expansile change in the superior left temporal bone again demonstrated, similar to previous study. Neoplasm not excluded. As previously suggested, MRI may be useful in further evaluation. No significant change since previous study. Paranasal sinuses and mastoid air cells are not opacified. CT CERVICAL SPINE FINDINGS There is straightening of the usual cervical lordosis. This may be due to patient positioning or degenerative change but ligamentous injury or muscle spasm could also have this appearance and are not excluded. Degenerative changes with narrowed interspaces and endplate hypertrophic changes in the lower cervical spine. Prominent disc osteophyte complex at C4-5 likely causes encroachment upon the central canal. Normal alignment of the posterior facet joints. No anterior subluxation of the cervical vertebrae. No vertebral compression deformities. No prevertebral soft tissue swelling. C1-2 articulation appears intact. Head is tilted towards the left, possibly representing scoliosis or muscle spasm. Soft tissues are unremarkable. IMPRESSION: No acute intracranial abnormalities. Early atrophic changes. 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 Neoplasm remains to be excluded and may consider MR for further evaluation. Nonspecific straightening of usual cervical lordosis. Degenerative changes in the cervical spine. No acute displaced fractures identified.  Electronically Signed   By: Burman NievesWilliam  Stevens M.D.   On: 06/27/2015 01:09   Ct Cervical Spine Wo Contrast  06/27/2015  CLINICAL DATA:  History of drinking. Four 5 falls last week. Bruises to the forehead, face, arms, legs, and throughout the body. Head injury. EXAM: CT HEAD  WITHOUT CONTRAST CT CERVICAL SPINE WITHOUT CONTRAST TECHNIQUE: Multidetector CT imaging of the head and cervical spine was performed following the standard protocol without intravenous contrast. Multiplanar CT image reconstructions of the cervical spine were also generated. COMPARISON:  07/22/2013 FINDINGS: CT HEAD FINDINGS Mild diffuse sulcal prominence suggesting atrophy. No ventricular dilatation. Ventricles and sulci appear symmetrical. No mass effect or midline shift. No abnormal extra-axial fluid collections. Gray-white matter junctions are distinct. Basal cisterns are not effaced. No evidence of acute intracranial hemorrhage. No depressed skull fractures. Bone erosion and expansile change in the superior left temporal bone again demonstrated, similar to previous study. Neoplasm not excluded. As previously suggested, MRI may be useful in further evaluation. No significant change since previous study. Paranasal sinuses and mastoid air cells are not opacified. CT CERVICAL SPINE FINDINGS There is straightening of the usual cervical lordosis. This may be due to patient positioning or degenerative change but ligamentous injury or muscle spasm could also have this appearance and are not excluded. Degenerative changes with narrowed interspaces and endplate hypertrophic changes in the lower cervical spine. Prominent disc osteophyte complex at C4-5 likely causes encroachment upon the central canal. Normal alignment of the posterior facet joints. No anterior subluxation of the cervical vertebrae. No vertebral compression deformities. No prevertebral soft tissue swelling. C1-2 articulation appears intact. Head is tilted towards the left, possibly  representing scoliosis or muscle spasm. Soft tissues are unremarkable. IMPRESSION: No acute intracranial abnormalities. Early atrophic changes. 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 Neoplasm remains to be excluded and may consider MR for further evaluation. Nonspecific straightening of usual cervical lordosis. Degenerative changes in the cervical spine. No acute displaced fractures identified. Electronically Signed   By: Burman Nieves M.D.   On: 06/27/2015 01:09   Dg Humerus Right  06/27/2015  CLINICAL DATA:  Status post falls, with right arm bruising. Initial encounter. EXAM: RIGHT HUMERUS - 2+ VIEW COMPARISON:  None. FINDINGS: There is no evidence of fracture or dislocation. The right humerus appears intact. The right humeral head remains seated at the glenoid fossa. The right acromioclavicular joint is grossly unremarkable. The elbow joint is grossly unremarkable in appearance. The visualized portions of the right lung are clear. IMPRESSION: No evidence of fracture or dislocation. Electronically Signed   By: Roanna Raider M.D.   On: 06/27/2015 01:03   Dg Femur, Min 2 Views Right  06/27/2015  CLINICAL DATA:  Long history of drinking. Multiple falls over the last week. Bruising to the legs. EXAM: RIGHT FEMUR 2 VIEWS COMPARISON:  None. FINDINGS: There is no evidence of fracture or other focal bone lesions. Soft tissues are unremarkable. IMPRESSION: Negative. Electronically Signed   By: Burman Nieves M.D.   On: 06/27/2015 01:37     CBC  Recent Labs Lab 06/26/15 2237  WBC 3.6  HGB 12.3  HCT 34.9*  PLT 42*  MCV 100.2*  MCH 35.2*  MCHC 35.2  RDW 12.6    Chemistries   Recent Labs Lab 06/26/15 2237 06/28/15 0414  NA 127* 133*  K 2.9* 4.0  CL 84* 101  CO2 23 19*  GLUCOSE 91 61*  BUN <5* <5*  CREATININE 0.39* 0.42*  CALCIUM 8.5* 8.6*  AST 140*  --   ALT 46  --   ALKPHOS 66  --   BILITOT 1.1  --     ------------------------------------------------------------------------------------------------------------------ estimated creatinine clearance is 61.2 mL/min (by C-G formula based on Cr of 0.42). ------------------------------------------------------------------------------------------------------------------  Recent Labs  06/26/15 2337  HGBA1C 4.5   ------------------------------------------------------------------------------------------------------------------ No results for input(s): CHOL, HDL, LDLCALC, TRIG, CHOLHDL, LDLDIRECT in the last 72 hours. ------------------------------------------------------------------------------------------------------------------  Recent Labs  06/26/15 2337  TSH 2.155   ------------------------------------------------------------------------------------------------------------------ No results for input(s): VITAMINB12, FOLATE, FERRITIN, TIBC, IRON, RETICCTPCT in the last 72 hours.  Coagulation profile No results for input(s): INR, PROTIME in the last 168 hours.  No results for input(s): DDIMER in the last 72 hours.  Cardiac Enzymes No results for input(s): CKMB, TROPONINI, MYOGLOBIN in the last 168 hours.  Invalid input(s): CK ------------------------------------------------------------------------------------------------------------------ Invalid input(s): POCBNP    Assessment & Plan   AThis is a 50 year old female admitted for alcohol intoxication, hyponatremia and thrombocytopenia. 1. Alcohol abuse:still has withdrawal continue CIWA protocol.  2. Hyponatremia: Secondary to alcohol intake improved with normal saline infusion  3. Thrombocytopenia: Secondary to alcohol abuse.   4. Hypokalemia potassium replaced 5. Depression: Continue sertraline 6. DVT prophylaxis: SCDs 7. GI prophylaxis: None      Code Status Orders        Start     Ordered   06/27/15 0316  Full code   Continuous     06/27/15 0316    Code Status  History    Date Active Date Inactive Code Status Order ID Comments User Context   This patient has a current code status but no historical code status.           Consults None DVT Prophylaxis  SCDs  Lab Results  Component Value Date   PLT 42* 06/26/2015     Time Spent in minutes30 minutes  Greater than 50% of time spent in care coordination and counseling patient regarding the condition and plan of care.   Auburn Bilberry M.D on 06/29/2015 at 1:31 PM  Between 7am to 6pm - Pager - [phone]  After 6pm go to [email or web address] - password [email or web address] [facility]  [facility] [place] [facility]   Office  [phone]

## 2015-06-29 NOTE — Progress Notes (Signed)
Patient to be moved closer to the nurses station for safety. Bo McclintockBrewer,Atalaya Zappia S, RN

## 2015-06-29 NOTE — Plan of Care (Signed)
Problem: Safety: Goal: Ability to remain free from injury will improve Outcome: Not Progressing Patient impulsive this shift.  Leaves bed without calling for assistance

## 2015-06-30 MED ORDER — LORAZEPAM 2 MG/ML IJ SOLN
1.0000 mg | Freq: Once | INTRAMUSCULAR | Status: AC
  Administered 2015-06-30: 02:00:00 1 mg via INTRAVENOUS
  Filled 2015-06-30: qty 1

## 2015-06-30 MED ORDER — LORAZEPAM 2 MG/ML IJ SOLN
1.0000 mg | Freq: Four times a day (QID) | INTRAMUSCULAR | Status: DC | PRN
  Administered 2015-06-30: 1 mg via INTRAVENOUS
  Filled 2015-06-30: qty 1

## 2015-06-30 MED ORDER — LORAZEPAM 1 MG PO TABS: 1.0000 mg | ORAL_TABLET | Freq: Four times a day (QID) | ORAL | Status: DC | PRN

## 2015-06-30 NOTE — Progress Notes (Signed)
Clinical Child psychotherapistocial Worker (CSW) attempted to meet with patient again however she was asleep and no family was at bedside.   Jetta LoutBailey Morgan, LCSW [phone]

## 2015-06-30 NOTE — Progress Notes (Signed)
Patient is resting comfortably. Mild tremors. CIWA remains q6h. Tanya Mercer,Tanya Gates S, RN

## 2015-06-30 NOTE — Plan of Care (Addendum)
Problem: Safety: Goal: Ability to remain free from injury will improve Outcome: Not Progressing Patient has been very agitated tonight.  Ativan was reduced during day shift to 1mg  every 6 hours.  Dr. Anne HahnWillis called and gave a one time dose of 1 mg Ativan and renewed discontinuing orders for CIWA Ativan.

## 2015-06-30 NOTE — Progress Notes (Signed)
Clinical Child psychotherapistocial Worker (CSW) received consult for substance abuse and placement. PT is recommending SNF. Patient does not have a payer for SNF. CSW attempted to meet with patient to complete assessment however she was asleep. CSW attempted to contact patient's husband Maisie Fushomas however he did not answer and a voicemail was left. CSW left substance abuse resources in patient's room. CSW will continue to follow and assist as needed.   Jetta LoutBailey Morgan, LCSW [phone]

## 2015-06-30 NOTE — Consult Note (Signed)
[facility] Face-to-Face Psychiatry Consult   Reason for Consult:  Consult for 50 year old woman with history of alcohol abuse currently in the hospital because of multiple falls Referring Physician:  Allena Katz Patient Identification: Tanya Mercer MRN:  [id number] Principal Diagnosis: Alcohol abuse Diagnosis:   Patient Active Problem List   Diagnosis Date Noted  . Thrombocytopenia (HCC) [D69.6] 06/27/2015  . Alcohol abuse [F10.10] 06/27/2015    Total Time spent with patient: 45 minutes  Subjective:   Tanya Mercer is a 50 y.o. female patient admitted with "I've been falling a lot".  Follow-up Wednesday the 24th. Patient has no new complaints. Today she denies having any hallucinations. She is able to tell me where she is and the correct year and why she is in the hospital. Pulses still elevated. Patient still has some degree of tremulousness. Still very withdrawn.  HPI:  Patient interviewed. Chart reviewed. Husband and daughter also present. Labs and vitals reviewed. 50 year old woman with a long history of alcohol abuse and depression comes into the hospital because of increasing problems with falls at home. Patient tells me she's falling multiple times a day. She is quite aware of the fact that this is a direct consequence of her heavy alcohol consumption. Patient says that she is drinking between 12 and 24 beers every day and has been doing so steadily for most of the last 10 years with only occasional rakes. She is not abusing any other drugs. Patient has not stopped long enough recently to know whether she's ever had seizures or DTs. She has been losing weight. Does not eat well. Loses balance frequently. Mood is staying down and depressed but without any suicidal thoughts. No hallucinations no psychosis. She is compliant with her prescribed antidepressant medicine. Patient expresses a desire to stop drinking and a willingness to engage in treatment.  Social history: Lives at home with her husband.  Has a daughter who is actively involved in trying to get treatment for her as well.  Medical history: Patient has multiple lab abnormalities consistent with heavy alcohol use including severe thrombocytopenia anemia with elevated MCV abnormal chemistry panel. She has lost a great deal of weight. She has bruises all over her face from multiple falls.  Substance abuse history: Long-standing alcohol abuse. She has been to rehabilitation treatment in the past but it's been several years ago. Longest sobriety as an adult is about 80 days at a time.  Past Psychiatric History: As treated as an outpatient for depression taking Zoloft and trazodone. No history of suicide attempts. She does have a history of previous admissions for alcohol detox.  Risk to Self: Is patient at risk for suicide?: No Risk to Others:   Prior Inpatient Therapy:   Prior Outpatient Therapy:    Past Medical History:  Past Medical History  Diagnosis Date  . Alcohol abuse   . Depression     Past Surgical History  Procedure Laterality Date  . Abdominal hysterectomy     Family History:  Family History  Problem Relation Age of Onset  . Diabetes Mellitus II Mother    Family Psychiatric  History: Family history positive for anxiety and depression Social History:  History  Alcohol Use  . 7.2 oz/week  . 12 Cans of beer per week     History  Drug Use Not on file    Social History   Social History  . Marital Status: Married    Spouse Name: N/A  . Number of Children: N/A  . Years  of Education: N/A   Social History Main Topics  . Smoking status: Current Every Day Smoker  . Smokeless tobacco: None  . Alcohol Use: 7.2 oz/week    12 Cans of beer per week  . Drug Use: None  . Sexual Activity: Not Asked   Other Topics Concern  . None   Social History Narrative  . None   Additional Social History:    Allergies:  No Known Allergies  Labs:  No results found for this or any previous visit (from the past 48  hour(s)).  Current Facility-Administered Medications  Medication Dose Route Frequency Provider Last Rate Last Dose  . 0.9 % NaCl with KCl 40 mEq / L  infusion   Intravenous Continuous Arnaldo NatalMichael S Diamond, MD 100 mL/hr at 06/30/15 1211 100 mL/hr at 06/30/15 1211  . docusate sodium (COLACE) capsule 100 mg  100 mg Oral BID Arnaldo NatalMichael S Diamond, MD   100 mg at 06/30/15 [id number]  . folic acid (FOLVITE) tablet 1 mg  1 mg Oral Daily Arnaldo NatalMichael S Diamond, MD   1 mg at 06/30/15 0803  . LORazepam (ATIVAN) injection 1 mg  1 mg Intravenous Q6H PRN Oralia Manisavid Willis, MD   1 mg at 06/30/15 [id number]   Or  . LORazepam (ATIVAN) tablet 1 mg  1 mg Oral Q6H PRN Oralia Manisavid Willis, MD      . multivitamin with minerals tablet 1 tablet  1 tablet Oral Daily Arnaldo NatalMichael S Diamond, MD   1 tablet at 06/30/15 0804  . nicotine (NICODERM CQ - dosed in mg/24 hours) patch 21 mg  21 mg Transdermal Daily Ramonita LabAruna Gouru, MD   21 mg at 06/30/15 0811  . ondansetron (ZOFRAN) tablet 4 mg  4 mg Oral Q6H PRN Arnaldo NatalMichael S Diamond, MD       Or  . ondansetron [facility](ZOFRAN) injection 4 mg  4 mg Intravenous Q6H PRN Arnaldo NatalMichael S Diamond, MD      . sertraline (ZOLOFT) tablet 50 mg  50 mg Oral Daily Arnaldo NatalMichael S Diamond, MD   50 mg at 06/30/15 [id number]  . thiamine (VITAMIN B-1) tablet 100 mg  100 mg Oral Daily Arnaldo NatalMichael S Diamond, MD   100 mg at 06/30/15 [id number]   Or  . thiamine (B-1) injection 100 mg  100 mg Intravenous Daily Arnaldo NatalMichael S Diamond, MD      . traZODone (DESYREL) tablet 300 mg  300 mg Oral QHS Arnaldo NatalMichael S Diamond, MD   300 mg at 06/29/15 2037    Musculoskeletal: Strength & Muscle Tone: decreased Gait & Station: unable to stand Patient leans: N/A  Psychiatric Specialty Exam: Physical Exam  Nursing note and vitals reviewed. Constitutional: She appears well-developed and well-nourished.  HENT:  Head: Normocephalic and atraumatic.  Eyes: Conjunctivae are normal. Pupils are equal, round, and reactive to light.  Neck: Normal range of motion.  Cardiovascular: Normal heart sounds.    Respiratory: Effort normal. No respiratory distress.  GI: Soft.  Musculoskeletal: Normal range of motion.  Neurological: She is alert.  Skin: Skin is warm and dry.  Psychiatric: Judgment and thought content normal. Her mood appears anxious. Her affect is blunt. Her speech is delayed. She is slowed. Cognition and memory are normal.    Review of Systems  Unable to perform ROS: psychiatric disorder    Blood pressure 125/80, pulse 117, temperature 98 F (36.7 C), temperature source Oral, resp. rate 20, height 5\' 1"  (1.549 m), weight 42.411 kg (93 lb 8 oz), SpO2 97 %.Body mass index is 17.68 kg/(m^2).  General Appearance: Disheveled  Eye Contact:  Good  Speech:  Slow  Volume:  Decreased  Mood:  Dysphoric  Affect:  Constricted  Thought Process:  Disorganized and Irrelevant  Orientation:  Negative  Thought Content:  Negative  Suicidal Thoughts:  No  Homicidal Thoughts:  No  Memory:  Negative  Judgement:  Fair  Insight:  Fair  Psychomotor Activity:  Decreased  Concentration:  Concentration: Poor  Recall:  Fair  Fund of Knowledge:  Fair  Language:  Fair  Akathisia:  No  Handed:  Right  AIMS (if indicated):     Assets:  Desire for Improvement Housing Intimacy Resilience  ADL's:  Impaired  Cognition:  Impaired,  Mild  Sleep:        Treatment Plan Summary: Daily contact with patient to assess and evaluate symptoms and progress in treatment, Medication management and Plan May be coming to the end of delirium tremens. Patient still needs watching and medicine as needed. Needs to eat and drink so that she can regain some strength. No change otherwise to any psychiatric treatment.  Disposition: Patient does not meet criteria for psychiatric inpatient admission. Supportive therapy provided about ongoing stressors.  Mordecai Rasmussen, MD 06/30/2015 5:05 PM

## 2015-06-30 NOTE — Progress Notes (Signed)
[facility] [place] at [facility]                                                                                                                                                                                            Patient Demographics   Tanya Mercer, is a 50 y.o. female, DOB - 1965-07-07, ZOX:[id number]  Admit date - 06/26/2015   Admitting Physician Arnaldo Natal, MD  Outpatient Primary MD for the patient is No primary care provider on file.   LOS - 3  Subjective: Patient is currently sedated due to withdrawal symptoms   Review of Systems:   CONSTITUTIONAL: Sedated  Vitals:   Filed Vitals:   06/29/15 1402 06/29/15 2119 06/30/15 0500 06/30/15 0546  BP: 117/85 131/93  122/87  Pulse: 102 90  97  Temp: 98.6 F (37 C) 98.8 F (37.1 C)  98 F (36.7 C)  TempSrc: Oral Oral  Oral  Resp: Height:      Weight:   42.411 kg (93 lb 8 oz)   SpO2: 100% 100%  100%    Wt Readings from Last 3 Encounters:  06/30/15 42.411 kg (93 lb 8 oz)     Intake/Output Summary (Last 24 hours) at 06/30/15 1400 Last data filed at 06/30/15 1023  Gross per 24 hour  Intake 951.67 ml  Output      0 ml  Net 951.67 ml    Physical Exam:   GENERAL: Sedated HEAD, EYES, EARS, NOSE AND THROAT: Atraumatic, normocephalic. Extraocular muscles are intact. Pupils equal and reactive to light. Sclerae anicteric. No conjunctival injection. No oro-pharyngeal erythema.  NECK: Supple. There is no jugular venous distention. No bruits, no lymphadenopathy, no thyromegaly.  HEART: Regular rate and rhythm,. No murmurs, no rubs, no clicks.  LUNGS: Clear to auscultation bilaterally. No rales or rhonchi. No wheezes.  ABDOMEN: Soft, flat, nontender, nondistended. Has good bowel sounds. No hepatosplenomegaly appreciated.  EXTREMITIES: No evidence of any cyanosis, clubbing, or peripheral edema.  +2 pedal and radial pulses bilaterally.  NEUROLOGIC: Sedated SKIN: Moist and  warm with no rashes appreciated.  Psych: Sedated LN: No inguinal LN enlargement    Antibiotics   Anti-infectives    None      Medications   Scheduled Meds: . docusate sodium  100 mg Oral BID  . folic acid  1 mg Oral Daily  . multivitamin with minerals  1 tablet Oral Daily  . nicotine  21 mg Transdermal Daily  . sertraline  50 mg Oral Daily  . thiamine  100 mg Oral Daily   Or  .  thiamine  100 mg Intravenous Daily  . trazodone  300 mg Oral QHS   Continuous Infusions: . 0.9 % NaCl with KCl 40 mEq / L 100 mL/hr (06/30/15 1211)   PRN Meds:.LORazepam **OR** LORazepam, ondansetron **OR** ondansetron (ZOFRAN) IV   Data Review:   Micro Results Recent Results (from the past 240 hour(s))  C difficile quick scan w PCR reflex     Status: None   Collection Time: 06/27/15  5:30 PM  Result Value Ref Range Status   C Diff antigen NEGATIVE NEGATIVE Final   C Diff toxin NEGATIVE NEGATIVE Final   C Diff interpretation Negative for C. difficile  Final    Radiology Reports Dg Chest 2 View  06/27/2015  CLINICAL DATA:  Acute onset of back and rib bruising. Initial encounter. EXAM: CHEST  2 VIEW COMPARISON:  None. FINDINGS: The lungs are well-aerated and clear. There is no evidence of focal opacification, pleural effusion or pneumothorax. The heart is normal in size; the mediastinal contour is within normal limits. No acute osseous abnormalities are seen. IMPRESSION: No acute cardiopulmonary process seen. No displaced rib fractures identified. Electronically Signed   By: Roanna Raider M.D.   On: 06/27/2015 01:02   Ct Head Wo Contrast  06/27/2015  CLINICAL DATA:  History of drinking. Four 5 falls last week. Bruises to the forehead, face, arms, legs, and throughout the body. Head injury. EXAM: CT HEAD WITHOUT CONTRAST CT CERVICAL SPINE WITHOUT CONTRAST TECHNIQUE: Multidetector CT imaging of the head and cervical spine was performed following the standard protocol without intravenous contrast.  Multiplanar CT image reconstructions of the cervical spine were also generated. COMPARISON:  07/22/2013 FINDINGS: CT HEAD FINDINGS Mild diffuse sulcal prominence suggesting atrophy. No ventricular dilatation. Ventricles and sulci appear symmetrical. No mass effect or midline shift. No abnormal extra-axial fluid collections. Gray-white matter junctions are distinct. Basal cisterns are not effaced. No evidence of acute intracranial hemorrhage. No depressed skull fractures. Bone erosion and expansile change in the superior left temporal bone again demonstrated, similar to previous study. Neoplasm not excluded. As previously suggested, MRI may be useful in further evaluation. No significant change since previous study. Paranasal sinuses and mastoid air cells are not opacified. CT CERVICAL SPINE FINDINGS There is straightening of the usual cervical lordosis. This may be due to patient positioning or degenerative change but ligamentous injury or muscle spasm could also have this appearance and are not excluded. Degenerative changes with narrowed interspaces and endplate hypertrophic changes in the lower cervical spine. Prominent disc osteophyte complex at C4-5 likely causes encroachment upon the central canal. Normal alignment of the posterior facet joints. No anterior subluxation of the cervical vertebrae. No vertebral compression deformities. No prevertebral soft tissue swelling. C1-2 articulation appears intact. Head is tilted towards the left, possibly representing scoliosis or muscle spasm. Soft tissues are unremarkable. IMPRESSION: No acute intracranial abnormalities. Early atrophic changes. 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 Neoplasm remains to be excluded and may consider MR for further evaluation. Nonspecific straightening of usual cervical lordosis. Degenerative changes in the cervical spine. No acute displaced fractures identified. Electronically  Signed   By: Burman Nieves M.D.   On: 06/27/2015 01:09   Ct Cervical Spine Wo Contrast  06/27/2015  CLINICAL DATA:  History of drinking. Four 5 falls last week. Bruises to the forehead, face, arms, legs, and throughout the body. Head injury. EXAM: CT HEAD WITHOUT CONTRAST CT CERVICAL SPINE WITHOUT CONTRAST TECHNIQUE: Multidetector CT imaging  of the head and cervical spine was performed following the standard protocol without intravenous contrast. Multiplanar CT image reconstructions of the cervical spine were also generated. COMPARISON:  07/22/2013 FINDINGS: CT HEAD FINDINGS Mild diffuse sulcal prominence suggesting atrophy. No ventricular dilatation. Ventricles and sulci appear symmetrical. No mass effect or midline shift. No abnormal extra-axial fluid collections. Gray-white matter junctions are distinct. Basal cisterns are not effaced. No evidence of acute intracranial hemorrhage. No depressed skull fractures. Bone erosion and expansile change in the superior left temporal bone again demonstrated, similar to previous study. Neoplasm not excluded. As previously suggested, MRI may be useful in further evaluation. No significant change since previous study. Paranasal sinuses and mastoid air cells are not opacified. CT CERVICAL SPINE FINDINGS There is straightening of the usual cervical lordosis. This may be due to patient positioning or degenerative change but ligamentous injury or muscle spasm could also have this appearance and are not excluded. Degenerative changes with narrowed interspaces and endplate hypertrophic changes in the lower cervical spine. Prominent disc osteophyte complex at C4-5 likely causes encroachment upon the central canal. Normal alignment of the posterior facet joints. No anterior subluxation of the cervical vertebrae. No vertebral compression deformities. No prevertebral soft tissue swelling. C1-2 articulation appears intact. Head is tilted towards the left, possibly representing  scoliosis or muscle spasm. Soft tissues are unremarkable. IMPRESSION: No acute intracranial abnormalities. Early atrophic changes. 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 Neoplasm remains to be excluded and may consider MR for further evaluation. Nonspecific straightening of usual cervical lordosis. Degenerative changes in the cervical spine. No acute displaced fractures identified. Electronically Signed   By: Burman NievesWilliam  Stevens M.D.   On: 06/27/2015 01:09   Dg Humerus Right  06/27/2015  CLINICAL DATA:  Status post falls, with right arm bruising. Initial encounter. EXAM: RIGHT HUMERUS - 2+ VIEW COMPARISON:  None. FINDINGS: There is no evidence of fracture or dislocation. The right humerus appears intact. The right humeral head remains seated at the glenoid fossa. The right acromioclavicular joint is grossly unremarkable. The elbow joint is grossly unremarkable in appearance. The visualized portions of the right lung are clear. IMPRESSION: No evidence of fracture or dislocation. Electronically Signed   By: Roanna RaiderJeffery  Chang M.D.   On: 06/27/2015 01:03   Dg Femur, Min 2 Views Right  06/27/2015  CLINICAL DATA:  Long history of drinking. Multiple falls over the last week. Bruising to the legs. EXAM: RIGHT FEMUR 2 VIEWS COMPARISON:  None. FINDINGS: There is no evidence of fracture or other focal bone lesions. Soft tissues are unremarkable. IMPRESSION: Negative. Electronically Signed   By: Burman NievesWilliam  Stevens M.D.   On: 06/27/2015 01:37     CBC  Recent Labs Lab 06/26/15 2237  WBC 3.6  HGB 12.3  HCT 34.9*  PLT 42*  MCV 100.2*  MCH 35.2*  MCHC 35.2  RDW 12.6    Chemistries   Recent Labs Lab 06/26/15 2237 06/28/15 0414  NA 127* 133*  K 2.9* 4.0  CL 84* 101  CO2 23 19*  GLUCOSE 91 61*  BUN <5* <5*  CREATININE 0.39* 0.42*  CALCIUM 8.5* 8.6*  AST 140*  --   ALT 46  --   ALKPHOS 66  --   BILITOT 1.1  --     ------------------------------------------------------------------------------------------------------------------ estimated creatinine clearance is 57.6 mL/min (by C-G formula based on Cr of 0.42). ------------------------------------------------------------------------------------------------------------------ No results for input(s): HGBA1C in the last 72 hours. ------------------------------------------------------------------------------------------------------------------ No results for  input(s): CHOL, HDL, LDLCALC, TRIG, CHOLHDL, LDLDIRECT in the last 72 hours. ------------------------------------------------------------------------------------------------------------------ No results for input(s): TSH, T4TOTAL, T3FREE, THYROIDAB in the last 72 hours.  Invalid input(s): FREET3 ------------------------------------------------------------------------------------------------------------------ No results for input(s): VITAMINB12, FOLATE, FERRITIN, TIBC, IRON, RETICCTPCT in the last 72 hours.  Coagulation profile No results for input(s): INR, PROTIME in the last 168 hours.  No results for input(s): DDIMER in the last 72 hours.  Cardiac Enzymes No results for input(s): CKMB, TROPONINI, MYOGLOBIN in the last 168 hours.  Invalid input(s): CK ------------------------------------------------------------------------------------------------------------------ Invalid input(s): POCBNP    Assessment & Plan   AThis is a 50 year old female admitted for alcohol intoxication, hyponatremia and thrombocytopenia. 1. Alcohol abuse:  Continue Ciwa protocol  2. Hyponatremia: Secondary to alcohol intake improved with normal saline infusion  3. Thrombocytopenia: Secondary to alcohol abuse. No evidence of bleeding  4. Hypokalemia potassium replaced 5. Depression: Continue sertraline 6. DVT prophylaxis: SCDs 7. GI prophylaxis: None      Code Status Orders        Start     Ordered    06/27/15 0316  Full code   Continuous     06/27/15 0316    Code Status History    Date Active Date Inactive Code Status Order ID Comments User Context   This patient has a current code status but no historical code status.           Consults None DVT Prophylaxis  SCDs  Lab Results  Component Value Date   PLT 42* 06/26/2015     Time Spent in minutes25 minutes  Greater than 50% of time spent in care coordination and counseling patient regarding the condition and plan of care.   Auburn Bilberry M.D on 06/30/2015 at 2:00 PM  Between 7am to 6pm - Pager - [phone]  After 6pm go to [email or web address] - password [email or web address] [facility]  [facility]   Office  [phone]

## 2015-07-01 MED ORDER — ADULT MULTIVITAMIN W/MINERALS CH: 1.0000 | ORAL_TABLET | Freq: Every day | ORAL | Status: AC

## 2015-07-01 NOTE — Progress Notes (Signed)
Pt being discharged home, discharge reviewed with pt and husband, explained to the pt that it is very important for her to quit drinking, pt states that she will not be drinking anymore, pt given numbers for rehab and AA, states understanding, pt with no noted complaints at discharge, no distress or discomfort noted

## 2015-07-01 NOTE — Care Management (Signed)
Discharge to home today per Dr. Allena KatzPatel. Rolling Dan HumphreysWalker will be arranged through [facility].  Information about [facility] given to Ms. Czarnecki. States she will be following up with Dr. Deberah PeltonJoel Moffitt her psychiatry person. Husband will transport. Gwenette GreetBrenda S Armentha Branagan RN MSN CCM Care Management [phone]

## 2015-07-01 NOTE — Progress Notes (Signed)
Pt's daughter called very upset about pt's D/C order. RN told daughter that MD would call and update her. MD paged and was given daughter's phone number.

## 2015-07-01 NOTE — Discharge Summary (Signed)
Tanya Mercer, 50 y.o., DOB 1966/02/02, MRN [id number]. Admission date: 06/26/2015 Discharge Date 07/01/2015 Primary MD No primary care provider on file. Admitting Physician Arnaldo Natal, MD  Admission Diagnosis  Hypokalemia [E87.6] Hyponatremia [E87.1] Ecchymosis [R58] Thrombocytopenia (HCC) [D69.6] Fall [W19.XXXA] Multiple contusions [T14.8]  Discharge Diagnosis   Principal Problem:   Alcohol abuse  Thrombocytopenia (HCC) Acute alcohol withdrawal Depression Hyponatremia       Hospital Course patient is a 50 year old white female who presented to the hospital after multiple falls. Has a history of heavy alcohol abuse. She was also noticed to be dehydrated. Patient was admitted and placed on call withdrawal protocol. Patient did undergo withdrawal symptoms. She was also seen by psychiatry they felt that she did not need any inpatient therapy treatment. She was seen by physical therapy with initially  recommended skilled nursing facility when patient was actively withdrawing. However reevaluation today shows the patient is able to be discharged at home with use of walker. Patient otherwise stable for discharge         Consults  None  Significant Tests:  See full reports for all details      Dg Chest 2 View  06/27/2015  CLINICAL DATA:  Acute onset of back and rib bruising. Initial encounter. EXAM: CHEST  2 VIEW COMPARISON:  None. FINDINGS: The lungs are well-aerated and clear. There is no evidence of focal opacification, pleural effusion or pneumothorax. The heart is normal in size; the mediastinal contour is within normal limits. No acute osseous abnormalities are seen. IMPRESSION: No acute cardiopulmonary process seen. No displaced rib fractures identified. Electronically Signed   By: Roanna Raider M.D.   On: 06/27/2015 01:02   Ct Head Wo Contrast  06/27/2015  CLINICAL DATA:  History of drinking. Four 5 falls last week. Bruises to the forehead, face, arms, legs, and  throughout the body. Head injury. EXAM: CT HEAD WITHOUT CONTRAST CT CERVICAL SPINE WITHOUT CONTRAST TECHNIQUE: Multidetector CT imaging of the head and cervical spine was performed following the standard protocol without intravenous contrast. Multiplanar CT image reconstructions of the cervical spine were also generated. COMPARISON:  07/22/2013 FINDINGS: CT HEAD FINDINGS Mild diffuse sulcal prominence suggesting atrophy. No ventricular dilatation. Ventricles and sulci appear symmetrical. No mass effect or midline shift. No abnormal extra-axial fluid collections. Gray-white matter junctions are distinct. Basal cisterns are not effaced. No evidence of acute intracranial hemorrhage. No depressed skull fractures. Bone erosion and expansile change in the superior left temporal bone again demonstrated, similar to previous study. Neoplasm not excluded. As previously suggested, MRI may be useful in further evaluation. No significant change since previous study. Paranasal sinuses and mastoid air cells are not opacified. CT CERVICAL SPINE FINDINGS There is straightening of the usual cervical lordosis. This may be due to patient positioning or degenerative change but ligamentous injury or muscle spasm could also have this appearance and are not excluded. Degenerative changes with narrowed interspaces and endplate hypertrophic changes in the lower cervical spine. Prominent disc osteophyte complex at C4-5 likely causes encroachment upon the central canal. Normal alignment of the posterior facet joints. No anterior subluxation of the cervical vertebrae. No vertebral compression deformities. No prevertebral soft tissue swelling. C1-2 articulation appears intact. Head is tilted towards the left, possibly representing scoliosis or muscle spasm. Soft tissues are unremarkable. IMPRESSION: No acute intracranial abnormalities. Early atrophic changes. 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 Neoplasm remains to be excluded and may consider MR for  further evaluation. Nonspecific straightening of usual cervical lordosis. Degenerative changes in the cervical spine. No acute displaced fractures identified. Electronically Signed   By: Burman NievesWilliam  Stevens M.D.   On: 06/27/2015 01:09   Ct Cervical Spine Wo Contrast  06/27/2015  CLINICAL DATA:  History of drinking. Four 5 falls last week. Bruises to the forehead, face, arms, legs, and throughout the body. Head injury. EXAM: CT HEAD WITHOUT CONTRAST CT CERVICAL SPINE WITHOUT CONTRAST TECHNIQUE: Multidetector CT imaging of the head and cervical spine was performed following the standard protocol without intravenous contrast. Multiplanar CT image reconstructions of the cervical spine were also generated. COMPARISON:  07/22/2013 FINDINGS: CT HEAD FINDINGS Mild diffuse sulcal prominence suggesting atrophy. No ventricular dilatation. Ventricles and sulci appear symmetrical. No mass effect or midline shift. No abnormal extra-axial fluid collections. Gray-white matter junctions are distinct. Basal cisterns are not effaced. No evidence of acute intracranial hemorrhage. No depressed skull fractures. Bone erosion and expansile change in the superior left temporal bone again demonstrated, similar to previous study. Neoplasm not excluded. As previously suggested, MRI may be useful in further evaluation. No significant change since previous study. Paranasal sinuses and mastoid air cells are not opacified. CT CERVICAL SPINE FINDINGS There is straightening of the usual cervical lordosis. This may be due to patient positioning or degenerative change but ligamentous injury or muscle spasm could also have this appearance and are not excluded. Degenerative changes with narrowed interspaces and endplate hypertrophic changes in the lower cervical spine. Prominent disc osteophyte complex at C4-5 likely causes encroachment upon the central canal. Normal alignment of  the posterior facet joints. No anterior subluxation of the cervical vertebrae. No vertebral compression deformities. No prevertebral soft tissue swelling. C1-2 articulation appears intact. Head is tilted towards the left, possibly representing scoliosis or muscle spasm. Soft tissues are unremarkable. IMPRESSION: No acute intracranial abnormalities. Early atrophic changes. 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 Neoplasm remains to be excluded and may consider MR for further evaluation. Nonspecific straightening of usual cervical lordosis. Degenerative changes in the cervical spine. No acute displaced fractures identified. Electronically Signed   By: Burman NievesWilliam  Stevens M.D.   On: 06/27/2015 01:09   Dg Humerus Right  06/27/2015  CLINICAL DATA:  Status post falls, with right arm bruising. Initial encounter. EXAM: RIGHT HUMERUS - 2+ VIEW COMPARISON:  None. FINDINGS: There is no evidence of fracture or dislocation. The right humerus appears intact. The right humeral head remains seated at the glenoid fossa. The right acromioclavicular joint is grossly unremarkable. The elbow joint is grossly unremarkable in appearance. The visualized portions of the right lung are clear. IMPRESSION: No evidence of fracture or dislocation. Electronically Signed   By: Roanna RaiderJeffery  Chang M.D.   On: 06/27/2015 01:03   Dg Femur, Min 2 Views Right  06/27/2015  CLINICAL DATA:  Long history of drinking. Multiple falls over the last week. Bruising to the legs. EXAM: RIGHT FEMUR 2 VIEWS COMPARISON:  None. FINDINGS: There is no evidence of fracture or other focal bone lesions. Soft tissues are unremarkable. IMPRESSION: Negative. Electronically Signed   By: Burman NievesWilliam  Stevens M.D.   On: 06/27/2015 01:37       Today   Subjective:   Tanya Mercer  patient feeling better wants to go home   Objective:   Blood pressure 116/80, pulse 116, temperature 97.9 F (36.6 C), temperature source  Oral, resp. rate 20, height 5\' 1"  (1.549 m), weight 43.364 kg (95 lb 9.6 oz),  SpO2 100 %.  .  Intake/Output Summary (Last 24 hours) at 07/01/15 1417 Last data filed at 07/01/15 1332  Gross per 24 hour  Intake 4357.67 ml  Output      0 ml  Net 4357.67 ml    Exam VITAL SIGNS: Blood pressure 116/80, pulse 116, temperature 97.9 F (36.6 C), temperature source Oral, resp. rate 20, height 5\' 1"  (1.549 m), weight 43.364 kg (95 lb 9.6 oz), SpO2 100 %.  GENERAL:  50 y.o.-year-old patient lying in the bed with no acute distress.  EYES: Pupils equal, round, reactive to light and accommodation. No scleral icterus. Extraocular muscles intact.  HEENT: Head atraumatic, normocephalic. Oropharynx and nasopharynx clear.  NECK:  Supple, no jugular venous distention. No thyroid enlargement, no tenderness.  LUNGS: Normal breath sounds bilaterally, no wheezing, rales,rhonchi or crepitation. No use of accessory muscles of respiration.  CARDIOVASCULAR: S1, S2 normal. No murmurs, rubs, or gallops.  ABDOMEN: Soft, nontender, nondistended. Bowel sounds present. No organomegaly or mass.  EXTREMITIES: No pedal edema, cyanosis, or clubbing.  NEUROLOGIC: Cranial nerves II through XII are intact. Muscle strength 5/5 in all extremities. Sensation intact. Gait not checked.  PSYCHIATRIC: The patient is alert and oriented x 3.  SKIN: No obvious rash, lesion, or ulcer.   Data Review     CBC w Diff: Lab Results  Component Value Date   WBC 3.6 06/26/2015   WBC 5.7 07/22/2013   HGB 12.3 06/26/2015   HGB 14.0 07/22/2013   HCT 34.9* 06/26/2015   HCT 40.7 07/22/2013   PLT 42* 06/26/2015   PLT 187 07/22/2013   CMP: Lab Results  Component Value Date   NA 133* 06/28/2015   NA 133* 07/23/2013   K 4.0 06/28/2015   K 3.9 07/22/2013   CL 101 06/28/2015   CL 93* 07/22/2013   CO2 19* 06/28/2015   CO2 22 07/22/2013   BUN <5* 06/28/2015   BUN 4* 07/22/2013   CREATININE 0.42* 06/28/2015   CREATININE 0.62 07/22/2013    PROT 7.4 06/26/2015   PROT 7.7 07/22/2013   ALBUMIN 4.4 06/26/2015   ALBUMIN 4.3 07/22/2013   BILITOT 1.1 06/26/2015   BILITOT 0.3 07/22/2013   ALKPHOS 66 06/26/2015   ALKPHOS 63 07/22/2013   AST 140* 06/26/2015   AST 100* 07/22/2013   ALT 46 06/26/2015   ALT 90* 07/22/2013  .  Micro Results Recent Results (from the past 240 hour(s))  C difficile quick scan w PCR reflex     Status: None   Collection Time: 06/27/15  5:30 PM  Result Value Ref Range Status   C Diff antigen NEGATIVE NEGATIVE Final   C Diff toxin NEGATIVE NEGATIVE Final   C Diff interpretation Negative for C. difficile  Final        Code Status Orders        Start     Ordered   06/27/15 0316  Full code   Continuous     06/27/15 0316    Code Status History    Date Active Date Inactive Code Status Order ID Comments User Context   This patient has a current code status but no historical code status.          Follow-up Information    Follow up with pcp In 7 days.      Discharge Medications     Medication List    TAKE these medications        multivitamin with minerals Tabs tablet  Take 1 tablet  by mouth daily.     sertraline 50 MG tablet  Commonly known as:  ZOLOFT  Take 50 mg by mouth daily.     trazodone 300 MG tablet  Commonly known as:  DESYREL  Take 300 mg by mouth at bedtime.           Total Time in preparing paper work, data evaluation and todays exam - 35 minutes  Auburn Bilberry M.D on 07/01/2015 at 2:17 PM  [facility]   Office  [phone]

## 2015-07-01 NOTE — Clinical Social Work Note (Signed)
Clinical Social Work Assessment  Patient Details  Name: Tanya Mercer MRN: [id number] Date of Birth: [birth year]  Date of referral:  07/01/15               Reason for consult:  Substance Use/ETOH Abuse, Facility Placement                Permission sought to share information with:  Chartered certified accountant granted to share information::  No  Name::        Agency::     Relationship::     Contact Information:     Housing/Transportation Living arrangements for the past 2 months:  Single Family Home Source of Information:  Patient Patient Interpreter Needed:  None Criminal Activity/Legal Involvement Pertinent to Current Situation/Hospitalization:  No - Comment as needed Significant Relationships:  Spouse, Friend Lives with:  Spouse Do you feel safe going back to the place where you live?  Yes Need for family participation in patient care:  Yes (Comment)  Care giving concerns:  Patient lives in [place] with her husband Marcello Moores.    Social Worker assessment / plan:  Holiday representative ([place]) received a substance abuse and SNF consult. CSW met with patient alone at bedside today. Patient was alert and oriented and was sitting up in the bed eating breakfast. CSW introduced self and explained role of CSW department. Patient reported that she lives in [place] with her husband and 8 cats. Per patient her plan is to return home and she has a friend that can help her at home. Per patient her husband hurt his back and is out of work right now. Before Marcello Moores hurt is back he was working 15-16 hours a day per patient. CSW discussed SNF options. Patient adamantly refused SNF. Patient does not want to consider SNF. Patient also does not have a payer for SNF. CSW discussed inpatient alcohol rehab options with patient. Patient refused inpatient substance abuse options and reported that she goes to [place] and sees Dr. Ernie Hew. CSW provided patient substance abuse resources in [facility]. Patient's plan is to return home.   Employment status:  Disabled (Comment on whether or not currently receiving Disability) Insurance information:  Self Pay (Medicaid Pending) PT Recommendations:  [place] / Referral to community resources:  Outpatient Substance Abuse Treatment Options  Patient/Family's Response to care:  Patient refused SNF and inpatient substance abuse program.   Patient/Family's Understanding of and Emotional Response to Diagnosis, Current Treatment, and Prognosis:  Patient was pleasant however adamant about returning home.   Emotional Assessment Appearance:  Appears stated age Attitude/Demeanor/Rapport:    Affect (typically observed):  Pleasant, Calm Orientation:  Oriented to Self, Oriented to Place, Oriented to  Time Alcohol / Substance use:  Alcohol Use Psych involvement (Current and /or in the community):  Yes (Comment)  Discharge Needs  Concerns to be addressed:  Discharge Planning Concerns Readmission within the last 30 days:  No Current discharge risk:  Dependent with Mobility, Substance Abuse Barriers to Discharge:  Continued Medical Work up   Loralyn Freshwater, LCSW 07/01/2015, 9:43 AM

## 2015-07-01 NOTE — Progress Notes (Signed)
Physical Therapy Treatment Patient Details Name: Tanya FerrariCandy T Caton MRN: [id number] DOB: 28-Jul-1965 Today's Date: 07/01/2015    History of Present Illness Patient is a 50 y/o female admitted after multiple falls, with thrombocytopenia. Currently reported to be withdrawing from alcohol.    PT Comments    Pt presents to PT this date with improved cognitive function, able to answer all questions appropriately and follow commands.  Pt assisted to bathroom and was Mod I for personal hygiene.  Pt ambulating in hall with cane with widened BOS and ataxic motor control.  Recommend RW as pt holding iv pole with opposite hand due to fatigue/weakness in LE's after 25'.  No LOB this date, but pt with decreased balance reactions and risk for continued falls. D/c plans updated due to pt's progress this date and would benefit from continued PT services for balance, gait, and strengthening.   Follow Up Recommendations  Home health PT;Supervision for mobility/OOB     Equipment Recommendations  Rolling walker with 5" wheels    Recommendations for Other Services       Precautions / Restrictions Precautions Precautions: Fall Restrictions Weight Bearing Restrictions: No    Mobility  Bed Mobility Overal bed mobility: Modified Independent Bed Mobility: Supine to Sit;Sit to Supine     Supine to sit: Modified independent (Device/Increase time)     General bed mobility comments: slow to rise with increased some tremors and ataxia  Transfers Overall transfer level: Needs assistance Equipment used: Straight cane Transfers: Sit to/from Stand Sit to Stand: Supervision         General transfer comment: Rising from bed and toilet with supervision, increased time required.  Ambulation/Gait Ambulation/Gait assistance: Supervision Ambulation Distance (Feet): 50 Feet Assistive device: Rolling walker (2 wheeled) Gait Pattern/deviations: Step-to pattern;Wide base of support Gait velocity: slow Gait  velocity interpretation: Below normal speed for age/gender General Gait Details: Slow gait with widened BOS and hips externally rotated, ataxic, decreased motor control.   Stairs            Wheelchair Mobility    Modified Rankin (Stroke Patients Only)       Balance Overall balance assessment: Needs assistance Sitting-balance support: Feet supported Sitting balance-Leahy Scale: Good     Standing balance support: Bilateral upper extremity supported;During functional activity Standing balance-Leahy Scale: Fair Standing balance comment: decreased balance reactions                    Cognition Arousal/Alertness: Awake/alert Behavior During Therapy: WFL for tasks assessed/performed Overall Cognitive Status: Within Functional Limits for tasks assessed                 General Comments: answering questions appropriately, able to ask for help with going to the bathroom    Exercises      General Comments        Pertinent Vitals/Pain Pain Assessment: No/denies pain    Home Living                      Prior Function            PT Goals (current goals can now be found in the care plan section) Acute Rehab PT Goals Patient Stated Goal: To get out of hospital  PT Goal Formulation: With patient Time For Goal Achievement: 07/12/15 Potential to Achieve Goals: Fair Progress towards PT goals: Progressing toward goals    Frequency  Min 2X/week    PT Plan Discharge plan needs to be updated  Co-evaluation             End of Session Equipment Utilized During Treatment: Gait belt Activity Tolerance: Patient tolerated treatment well Patient left: in bed;with bed alarm set;with family/visitor present     Time: 7829-5621 PT Time Calculation (min) (ACUTE ONLY): 30 min  Charges:  $Gait Training: 8-22 mins $Therapeutic Activity: 8-22 mins                    G Codes:      Chrishonda Hesch A Rhonda Vangieson, PT July 14, 2015, 12:35 PM

## 2015-07-01 NOTE — Discharge Instructions (Signed)
°  DIET:  Regular diet  DISCHARGE CONDITION:  Fair  ACTIVITY:  Activity as tolerated  OXYGEN:  Home Oxygen: No.   Oxygen Delivery: room air  DISCHARGE LOCATION:  home    ADDITIONAL DISCHARGE INSTRUCTION:stop drinking     If you experience worsening of your admission symptoms, develop shortness of breath, life threatening emergency, suicidal or homicidal thoughts you must seek medical attention immediately by calling 911 or calling your MD immediately  if symptoms less severe.  You Must read complete instructions/literature along with all the possible adverse reactions/side effects for all the Medicines you take and that have been prescribed to you. Take any new Medicines after you have completely understood and accpet all the possible adverse reactions/side effects.   Please note  You were cared for by a hospitalist during your hospital stay. If you have any questions about your discharge medications or the care you received while you were in the hospital after you are discharged, you can call the unit and asked to speak with the hospitalist on call if the hospitalist that took care of you is not available. Once you are discharged, your primary care physician will handle any further medical issues. Please note that NO REFILLS for any discharge medications will be authorized once you are discharged, as it is imperative that you return to your primary care physician (or establish a relationship with a primary care physician if you do not have one) for your aftercare needs so that they can reassess your need for medications and monitor your lab values.

## 2022-09-13 ENCOUNTER — Other Ambulatory Visit: Payer: Self-pay | Admitting: Nurse Practitioner

## 2022-09-13 DIAGNOSIS — Z1231 Encounter for screening mammogram for malignant neoplasm of breast: Secondary | ICD-10-CM

## 2022-11-15 ENCOUNTER — Ambulatory Visit
Admission: RE | Admit: 2022-11-15 | Discharge: 2022-11-15 | Disposition: A | Discharge status: Home / Self Care | Payer: BC Managed Care – PPO | Source: Ambulatory Visit | Attending: Nurse Practitioner | Admitting: Nurse Practitioner

## 2022-11-15 DIAGNOSIS — Z1231 Encounter for screening mammogram for malignant neoplasm of breast: Secondary | ICD-10-CM | POA: Diagnosis present

## 2023-12-28 LAB — COLOGUARD: COLOGUARD: POSITIVE — AB

## 2024-02-28 ENCOUNTER — Encounter: Payer: Self-pay | Admitting: Gastroenterology

## 2024-02-29 ENCOUNTER — Ambulatory Visit
Admission: RE | Admit: 2024-02-29 | Discharge: 2024-02-29 | Disposition: A | Discharge status: Home / Self Care | Attending: Gastroenterology | Admitting: Gastroenterology

## 2024-02-29 ENCOUNTER — Encounter: Payer: Self-pay | Admitting: Gastroenterology

## 2024-02-29 ENCOUNTER — Ambulatory Visit

## 2024-02-29 ENCOUNTER — Other Ambulatory Visit: Payer: Self-pay

## 2024-02-29 ENCOUNTER — Encounter
Admission: RE | Disposition: A | Discharge status: Home / Self Care | Payer: Self-pay | Source: Home / Self Care | Attending: Gastroenterology

## 2024-02-29 DIAGNOSIS — K640 First degree hemorrhoids: Secondary | ICD-10-CM | POA: Diagnosis not present

## 2024-02-29 DIAGNOSIS — F17200 Nicotine dependence, unspecified, uncomplicated: Secondary | ICD-10-CM | POA: Diagnosis not present

## 2024-02-29 DIAGNOSIS — R195 Other fecal abnormalities: Secondary | ICD-10-CM | POA: Diagnosis not present

## 2024-02-29 DIAGNOSIS — F32A Depression, unspecified: Secondary | ICD-10-CM | POA: Diagnosis not present

## 2024-02-29 DIAGNOSIS — Z1211 Encounter for screening for malignant neoplasm of colon: Secondary | ICD-10-CM | POA: Diagnosis present

## 2024-02-29 DIAGNOSIS — D128 Benign neoplasm of rectum: Secondary | ICD-10-CM | POA: Insufficient documentation

## 2024-02-29 HISTORY — DX: Tobacco use: Z72.0

## 2024-02-29 HISTORY — DX: Anxiety disorder, unspecified: F41.9

## 2024-02-29 MED ORDER — PROPOFOL 500 MG/50ML IV EMUL
INTRAVENOUS | Status: DC | PRN
  Administered 2024-02-29: 200 ug/kg/min via INTRAVENOUS
  Administered 2024-02-29 (×2): 50 mg via INTRAVENOUS

## 2024-02-29 MED ORDER — SODIUM CHLORIDE 0.9 % IV SOLN: INTRAVENOUS | Status: DC

## 2024-02-29 NOTE — H&P (Signed)
 "  Pre-Procedure H&P   Patient ID: Tanya Mercer is a 59 y.o. female.  Gastroenterology Provider: Elspeth Ozell Jungling, DO  Referring Provider: Edsel Pepper, PA PCP: Abran Ival ORN, FNP  Date: 02/29/2024  HPI [name] is a 59 y.o. female who presents today for Colonoscopy for Positive Cologuard .  Reports daily bowel movement without melena or hematochezia.  No family history of colon cancer or colon polyps. Active tobacco use Cologuard + 12/28/2023   Past Medical History:  Diagnosis Date   Alcohol abuse    Anxiety    Depression    Tobacco abuse     Past Surgical History:  Procedure Laterality Date   ABDOMINAL HYSTERECTOMY     LARYNX SURGERY      Family History No h/o GI disease or malignancy  Review of Systems  Constitutional:  Negative for activity change, appetite change, chills, diaphoresis, fatigue, fever and unexpected weight change.  HENT:  Negative for trouble swallowing and voice change.   Respiratory:  Negative for shortness of breath and wheezing.   Cardiovascular:  Negative for chest pain, palpitations and leg swelling.  Gastrointestinal:  Negative for abdominal distention, abdominal pain, anal bleeding, blood in stool, constipation, diarrhea, nausea, rectal pain and vomiting.  Musculoskeletal:  Negative for arthralgias and myalgias.  Skin:  Negative for color change and pallor.  Neurological:  Negative for dizziness, syncope and weakness.  Psychiatric/Behavioral:  Negative for confusion.   All other systems reviewed and are negative.    Medications Medications Ordered Prior to Encounter[1]  Pertinent medications related to GI and procedure were reviewed by me with the patient prior to the procedure  Current Medications[2]  sodium chloride  20 mL/hr at 02/29/24 1100       Allergies[3] Allergies were reviewed by me prior to the procedure  Objective   Body mass index is 20.94 kg/m. Vitals:   02/29/24 1038  BP: 114/70  Pulse:  69  Resp: 18  Temp: (!) 97.1 F (36.2 C)  TempSrc: Temporal  SpO2: 100%  Weight: 50.3 kg  Height: 5' 1 (1.549 m)     Physical Exam Vitals and nursing note reviewed.  Constitutional:      General: She is not in acute distress.    Appearance: Normal appearance. She is not ill-appearing, toxic-appearing or diaphoretic.  HENT:     Head: Normocephalic and atraumatic.     Nose: Nose normal.     Mouth/Throat:     Mouth: Mucous membranes are moist.     Pharynx: Oropharynx is clear.  Eyes:     General: No scleral icterus.    Extraocular Movements: Extraocular movements intact.  Cardiovascular:     Rate and Rhythm: Normal rate and regular rhythm.     Heart sounds: Normal heart sounds. No murmur heard.    No friction rub. No gallop.  Pulmonary:     Effort: Pulmonary effort is normal. No respiratory distress.     Breath sounds: Normal breath sounds. No wheezing, rhonchi or rales.  Abdominal:     General: Bowel sounds are normal. There is no distension.     Palpations: Abdomen is soft.     Tenderness: There is no abdominal tenderness. There is no guarding or rebound.  Musculoskeletal:     Cervical back: Neck supple.     Right lower leg: No edema.     Left lower leg: No edema.  Skin:    General: Skin is warm and dry.     Coloration:  Skin is not jaundiced or pale.  Neurological:     General: No focal deficit present.     Mental Status: She is alert and oriented to person, place, and time. Mental status is at baseline.  Psychiatric:        Mood and Affect: Mood normal.        Behavior: Behavior normal.        Thought Content: Thought content normal.        Judgment: Judgment normal.      Assessment:  [name] is a 59 y.o. female  who presents today for Colonoscopy for Positive Cologuard .  Plan:  Colonoscopy with possible intervention today  Colonoscopy with possible biopsy, control of bleeding, polypectomy, and interventions as necessary has been discussed  with the patient/patient representative. Informed consent was obtained from the patient/patient representative after explaining the indication, nature, and risks of the procedure including but not limited to death, bleeding, perforation, missed neoplasm/lesions, cardiorespiratory compromise, and reaction to medications. Opportunity for questions was given and appropriate answers were provided. Patient/patient representative has verbalized understanding is amenable to undergoing the procedure.   Elspeth Ozell Jungling, DO  [facility]  Portions of the record may have been created with voice recognition software. Occasional wrong-word or 'sound-a-like' substitutions may have occurred due to the inherent limitations of voice recognition software.  Read the chart carefully and recognize, using context, where substitutions may have occurred.     [1]  No current facility-administered medications on file prior to encounter.   Current Outpatient Medications on File Prior to Encounter  Medication Sig Dispense Refill   cyanocobalamin (VITAMIN B12) 250 MCG tablet Take 250 mcg by mouth daily.     trazodone  (DESYREL ) 300 MG tablet Take 300 mg by mouth at bedtime.     Multiple Vitamin (MULTIVITAMIN WITH MINERALS) TABS tablet Take 1 tablet by mouth daily. 30 tablet 0   sertraline  (ZOLOFT ) 50 MG tablet Take 50 mg by mouth daily.    [2]  Current Facility-Administered Medications:    0.9 %  sodium chloride  infusion, , Intravenous, Continuous, Jungling Elspeth Ozell, DO, Last Rate: 20 mL/hr at 02/29/24 1100, Continued from Pre-op at 02/29/24 1100 [3] No Known Allergies  "

## 2024-02-29 NOTE — Anesthesia Postprocedure Evaluation (Signed)
"   Anesthesia Post Note  Patient: Tanya Mercer  Procedure(s) Performed: COLONOSCOPY  Patient location during evaluation: PACU Anesthesia Type: General Level of consciousness: awake and alert Pain management: pain level controlled Vital Signs Assessment: post-procedure vital signs reviewed and stable Respiratory status: spontaneous breathing, nonlabored ventilation and respiratory function stable Cardiovascular status: blood pressure returned to baseline and stable Postop Assessment: no apparent nausea or vomiting Anesthetic complications: no   There were no known notable events for this encounter.   Last Vitals:  Vitals:   02/29/24 1155 02/29/24 1205  BP: 101/69 129/77  Pulse: 72 64  Resp: 10 (!) 23  Temp:    SpO2: 100% 100%    Last Pain:  Vitals:   02/29/24 1205  TempSrc:   PainSc: 0-No pain                 Shau-Shau Melia      "

## 2024-02-29 NOTE — Interval H&P Note (Signed)
 History and Physical Interval Note: Preprocedure H&P from 02/29/24  was reviewed and there was no interval change after seeing and examining the patient.  Written consent was obtained from the patient after discussion of risks, benefits, and alternatives. Patient has consented to proceed with Colonoscopy with possible intervention   02/29/2024 11:22 AM  Jordy T Vanmaanen  has presented today for surgery, with the diagnosis of Screening for colon cancer (Z12.11).  The various methods of treatment have been discussed with the patient and family. After consideration of risks, benefits and other options for treatment, the patient has consented to  Procedures: COLONOSCOPY (N/A) as a surgical intervention.  The patient's history has been reviewed, patient examined, no change in status, stable for surgery.  I have reviewed the patient's chart and labs.  Questions were answered to the patient's satisfaction.     Elspeth Ozell Jungling

## 2024-02-29 NOTE — Transfer of Care (Signed)
 Immediate Anesthesia Transfer of Care Note  Patient: Tanya Mercer  Procedure(s) Performed: COLONOSCOPY  Patient Location: Endoscopy Unit  Anesthesia Type:General  Level of Consciousness: awake  Airway & Oxygen Therapy: Patient Spontanous Breathing  Post-op Assessment: Report given to RN and Post -op Vital signs reviewed and stable  Post vital signs: Reviewed and stable  Last Vitals:  Vitals Value Taken Time  BP 101/69 02/29/24 11:55  Temp    Pulse 72 02/29/24 11:56  Resp 11 02/29/24 11:56  SpO2 100 % 02/29/24 11:56  Vitals shown include unfiled device data.  Last Pain:  Vitals:   02/29/24 1155  TempSrc:   PainSc: 0-No pain         Complications: There were no known notable events for this encounter.

## 2024-02-29 NOTE — Anesthesia Preprocedure Evaluation (Addendum)
"                                    Anesthesia Evaluation    Airway Mallampati: II  TM Distance: >3 FB Neck ROM: Full    Dental   Pulmonary former smoker   breath sounds clear to auscultation       Cardiovascular  Rhythm:Regular     Neuro/Psych  PSYCHIATRIC DISORDERS  Depression       GI/Hepatic   Endo/Other    Renal/GU      Musculoskeletal   Abdominal   Peds  Hematology   Anesthesia Other Findings   Reproductive/Obstetrics                              Anesthesia Physical Anesthesia Plan  ASA: 2  Anesthesia Plan: General   Post-op Pain Management:    Induction:   PONV Risk Score and Plan: TIVA and Propofol infusion  Airway Management Planned:   Additional Equipment:   Intra-op Plan:   Post-operative Plan:   Informed Consent: I have reviewed the patients History and Physical, chart, labs and discussed the procedure including the risks, benefits and alternatives for the proposed anesthesia with the patient or authorized representative who has indicated his/her understanding and acceptance.       Plan Discussed with: CRNA  Anesthesia Plan Comments:          Anesthesia Quick Evaluation  "

## 2024-02-29 NOTE — Op Note (Signed)
 [facility]astroenter[facility] Patient Name: Tanya Mercer Procedure Date: 02/29/2024 11:16 AM MRN: [id number] Account #: [id number] Date of Birth: 16-Aug-1965 Admit Type: Outpatient Age: 59 Room: [facility] ENDO ROOM 1 Gender: Female Note Status: Finalized Instrument Name: Peds Colonoscope [id number] Procedure:             Colonoscopy Indications:           Positive Cologuard test Providers:             Elspeth Ozell Onita ROSALEA, DO Referring MD:          Elspeth Ozell Onita DO, DO (Referring MD), Ival MICAEL Kiang (Referring MD) Medicines:             Monitored Anesthesia Care Complications:         No immediate complications. Estimated blood loss:                         Minimal. Procedure:             Pre-Anesthesia Assessment:                        - Prior to the procedure, a History and Physical was                         performed, and patient medications and allergies were                         reviewed. The patient is competent. The risks and                         benefits of the procedure and the sedation options and                         risks were discussed with the patient. All questions                         were answered and informed consent was obtained.                         Patient identification and proposed procedure were                         verified by the physician, the nurse, the anesthetist                         and the technician in the endoscopy suite. Mental                         Status Examination: alert and oriented. Airway                         Examination: normal oropharyngeal airway and neck                         mobility. Respiratory Examination: clear to  auscultation. CV Examination: RRR, no murmurs, no S3                         or S4. Prophylactic Antibiotics: The patient does not                         require prophylactic antibiotics. Prior                          Anticoagulants: The patient has taken no anticoagulant                         or antiplatelet agents. ASA Grade Assessment: II - A                         patient with mild systemic disease. After reviewing                         the risks and benefits, the patient was deemed in                         satisfactory condition to undergo the procedure. The                         anesthesia plan was to use monitored anesthesia care                         (MAC). Immediately prior to administration of                         medications, the patient was re-assessed for adequacy                         to receive sedatives. The heart rate, respiratory                         rate, oxygen saturations, blood pressure, adequacy of                         pulmonary ventilation, and response to care were                         monitored throughout the procedure. The physical                         status of the patient was re-assessed after the                         procedure.                        After obtaining informed consent, the colonoscope was                         passed under direct vision. Throughout the procedure,                         the patient's blood pressure, pulse, and oxygen  saturations were monitored continuously. The                         Colonoscope was introduced through the anus and                         advanced to the the cecum, identified by appendiceal                         orifice and ileocecal valve. The colonoscopy was                         performed without difficulty. The patient tolerated                         the procedure well. The quality of the bowel                         preparation was evaluated using the BBPS [facility] Bowel                         Preparation Scale) with scores of: Right Colon = 3,                         Transverse Colon = 3 and Left Colon = 3 (entire mucosa                         seen well  with no residual staining, small fragments                         of stool or opaque liquid). The total BBPS score                         equals 9. The ileocecal valve, appendiceal orifice,                         and rectum were photographed. Findings:      The perianal and digital rectal examinations were normal. Pertinent       negatives include normal sphincter tone.      Retroflexion in the right colon was performed.      A 1 to 2 mm polyp was found in the rectum. The polyp was sessile. The       polyp was removed with a jumbo cold forceps. Resection and retrieval       were complete. Estimated blood loss was minimal.      A 4 to 5 mm polyp was found in the rectum. The polyp was sessile. The       polyp was removed with a cold snare. Resection and retrieval were       complete. Estimated blood loss was minimal.      Non-bleeding internal hemorrhoids were found during retroflexion. The       hemorrhoids were Grade I (internal hemorrhoids that do not prolapse).       Estimated blood loss: none.      The exam was otherwise without abnormality on direct and retroflexion       views. Impression:            - One  1 to 2 mm polyp in the rectum, removed with a                         jumbo cold forceps. Resected and retrieved.                        - One 4 to 5 mm polyp in the rectum, removed with a                         cold snare. Resected and retrieved.                        - Non-bleeding internal hemorrhoids.                        - The examination was otherwise normal on direct and                         retroflexion views. Recommendation:        - Patient has a contact number available for                         emergencies. The signs and symptoms of potential                         delayed complications were discussed with the patient.                         Return to normal activities tomorrow. Written                         discharge instructions were provided to the  patient.                        - Discharge patient to home.                        - Resume previous diet.                        - Continue present medications.                        - Await pathology results.                        - Repeat colonoscopy for surveillance based on                         pathology results.                        - Return to referring physician as previously                         scheduled.                        - The findings and recommendations were discussed with  the patient. Procedure Code(s):     --- Professional ---                        [phone], Colonoscopy, flexible; with removal of                         tumor(s), polyp(s), or other lesion(s) by snare                         technique                        45380, 59, Colonoscopy, flexible; with biopsy, single                         or multiple Diagnosis Code(s):     --- Professional ---                        D12.8, Benign neoplasm of rectum                        K64.0, First degree hemorrhoids                        R19.5, Other fecal abnormalities CPT copyright 2022 [employer]. All rights reserved. The codes documented in this report are preliminary and upon coder review may  be revised to meet current compliance requirements. Attending Participation:      I personally performed the entire procedure. Elspeth Jungling, DO Elspeth Ozell Jungling DO, DO 02/29/2024 11:55:47 AM This report has been signed electronically. Number of Addenda: 0 Note Initiated On: 02/29/2024 11:16 AM Scope Withdrawal Time: 0 hours 11 minutes 6 seconds  Total Procedure Duration: 0 hours 15 minutes 26 seconds  Estimated Blood Loss:  Estimated blood loss was minimal.      [facility]

## 2024-03-03 LAB — SURGICAL PATHOLOGY
